# Patient Record
Sex: Female | Born: 1975 | Race: White | Hispanic: No | Marital: Married | Smoking: Never smoker
Health system: Southern US, Community
[De-identification: ages and names within clinical notes are randomized; demographics above are authoritative.]

## PROBLEM LIST (undated history)

## (undated) DIAGNOSIS — E041 Nontoxic single thyroid nodule: Secondary | ICD-10-CM

## (undated) DIAGNOSIS — I472 Ventricular tachycardia, unspecified: Secondary | ICD-10-CM

## (undated) DIAGNOSIS — Z8619 Personal history of other infectious and parasitic diseases: Secondary | ICD-10-CM

## (undated) HISTORY — DX: Nontoxic single thyroid nodule: E04.1

## (undated) HISTORY — DX: Personal history of other infectious and parasitic diseases: Z86.19

---

## 1998-03-22 ENCOUNTER — Other Ambulatory Visit: Admission: RE | Admit: 1998-03-22 | Discharge: 1998-03-22 | Payer: Self-pay | Admitting: Family Medicine

## 1999-02-27 ENCOUNTER — Inpatient Hospital Stay (HOSPITAL_COMMUNITY): Admission: AD | Admit: 1999-02-27 | Discharge: 1999-03-02 | Payer: Self-pay | Admitting: Obstetrics & Gynecology

## 1999-03-03 ENCOUNTER — Encounter (HOSPITAL_COMMUNITY): Admission: RE | Admit: 1999-03-03 | Discharge: 1999-06-01 | Payer: Self-pay | Admitting: Obstetrics & Gynecology

## 1999-04-24 ENCOUNTER — Other Ambulatory Visit: Admission: RE | Admit: 1999-04-24 | Discharge: 1999-04-24 | Payer: Self-pay | Admitting: Obstetrics & Gynecology

## 2000-08-02 ENCOUNTER — Other Ambulatory Visit: Admission: RE | Admit: 2000-08-02 | Discharge: 2000-08-02 | Payer: Self-pay | Admitting: Obstetrics & Gynecology

## 2002-02-02 ENCOUNTER — Other Ambulatory Visit: Admission: RE | Admit: 2002-02-02 | Discharge: 2002-02-02 | Payer: Self-pay | Admitting: Obstetrics & Gynecology

## 2002-10-15 DIAGNOSIS — E041 Nontoxic single thyroid nodule: Secondary | ICD-10-CM

## 2002-10-15 HISTORY — DX: Nontoxic single thyroid nodule: E04.1

## 2003-02-06 ENCOUNTER — Inpatient Hospital Stay (HOSPITAL_COMMUNITY): Admission: AD | Admit: 2003-02-06 | Discharge: 2003-02-08 | Payer: Self-pay | Admitting: Obstetrics and Gynecology

## 2003-02-28 ENCOUNTER — Inpatient Hospital Stay (HOSPITAL_COMMUNITY): Admission: AD | Admit: 2003-02-28 | Discharge: 2003-02-28 | Payer: Self-pay | Admitting: Obstetrics & Gynecology

## 2003-03-02 ENCOUNTER — Other Ambulatory Visit: Admission: RE | Admit: 2003-03-02 | Discharge: 2003-03-02 | Payer: Self-pay | Admitting: Obstetrics and Gynecology

## 2004-06-01 ENCOUNTER — Other Ambulatory Visit: Admission: RE | Admit: 2004-06-01 | Discharge: 2004-06-01 | Payer: Self-pay | Admitting: Obstetrics and Gynecology

## 2006-02-13 ENCOUNTER — Ambulatory Visit: Payer: Self-pay | Admitting: Internal Medicine

## 2009-08-05 ENCOUNTER — Inpatient Hospital Stay (HOSPITAL_COMMUNITY): Admission: AD | Admit: 2009-08-05 | Discharge: 2009-08-11 | Payer: Self-pay | Admitting: Cardiology

## 2009-08-05 ENCOUNTER — Ambulatory Visit: Payer: Self-pay | Admitting: Internal Medicine

## 2009-08-08 ENCOUNTER — Encounter: Payer: Self-pay | Admitting: Internal Medicine

## 2009-08-24 ENCOUNTER — Ambulatory Visit: Payer: Self-pay | Admitting: Internal Medicine

## 2010-12-05 ENCOUNTER — Encounter (HOSPITAL_COMMUNITY): Payer: Self-pay

## 2011-01-18 LAB — COMPREHENSIVE METABOLIC PANEL
Alkaline Phosphatase: 48 U/L (ref 39–117)
BUN: 7 mg/dL (ref 6–23)
CO2: 23 mEq/L (ref 19–32)
Calcium: 9.9 mg/dL (ref 8.4–10.5)
Chloride: 106 mEq/L (ref 96–112)
Creatinine, Ser: 0.72 mg/dL (ref 0.4–1.2)
Glucose, Bld: 101 mg/dL — ABNORMAL HIGH (ref 70–99)
Total Protein: 8.2 g/dL (ref 6.0–8.3)

## 2011-01-18 LAB — BASIC METABOLIC PANEL
BUN: 12 mg/dL (ref 6–23)
CO2: 25 mEq/L (ref 19–32)
Calcium: 9.1 mg/dL (ref 8.4–10.5)
GFR calc Af Amer: >60 mL/min (ref >60)
GFR calc non Af Amer: >60 mL/min (ref >60)
GFR calc non Af Amer: >60 mL/min (ref >60)
Sodium: 139 mEq/L (ref 135–145)

## 2011-01-18 LAB — CBC
HCT: 46.1 % — ABNORMAL HIGH (ref 36.0–46.0)
Hemoglobin: 16.6 g/dL — ABNORMAL HIGH (ref 12.0–15.0)
MCV: 92.7 fL (ref 78.0–100.0)
MCV: 94.7 fL (ref 78.0–100.0)
Platelets: 142 10*3/uL — ABNORMAL LOW (ref 150–400)
Platelets: 160 10*3/uL (ref 150–400)

## 2011-01-18 LAB — PROTIME-INR: Prothrombin Time: 11.9 seconds (ref 11.6–15.2)

## 2011-01-18 LAB — TSH: TSH: 2.838 u[IU]/mL (ref 0.350–4.500)

## 2011-01-18 LAB — MAGNESIUM: Magnesium: 2.2 mg/dL (ref 1.5–2.5)

## 2011-12-12 LAB — BASIC METABOLIC PANEL
BUN: 15 (ref 4–21)
CREATININE: 0.8 (ref 0.5–1.1)
GLUCOSE: 86
POTASSIUM: 4.1 (ref 3.4–5.3)
Sodium: 137 (ref 137–147)

## 2011-12-12 LAB — CBC AND DIFFERENTIAL
HCT: 47 — AB (ref 36–46)
HEMOGLOBIN: 15.7 (ref 12.0–16.0)
Platelets: 165 (ref 150–399)
WBC: 6.7

## 2011-12-12 LAB — HEPATIC FUNCTION PANEL
ALK PHOS: 46 (ref 25–125)
ALT: 9 (ref 7–35)
AST: 12 — AB (ref 13–35)
BILIRUBIN, TOTAL: 0.6

## 2011-12-12 LAB — LIPID PANEL
Cholesterol: 230 — AB (ref 0–200)
HDL: 82 — AB (ref 35–70)
LDL Cholesterol: 27
LDl/HDL Ratio: 3
TRIGLYCERIDES: 135 (ref 40–160)

## 2011-12-12 LAB — VITAMIN D 25 HYDROXY (VIT D DEFICIENCY, FRACTURES): VIT D 25 HYDROXY: 70

## 2011-12-12 LAB — TSH: TSH: 2.96 (ref 0.41–5.90)

## 2012-05-01 ENCOUNTER — Other Ambulatory Visit: Payer: Self-pay | Admitting: Physician Assistant

## 2012-05-01 DIAGNOSIS — K824 Cholesterolosis of gallbladder: Secondary | ICD-10-CM

## 2012-05-06 ENCOUNTER — Other Ambulatory Visit: Payer: Self-pay

## 2012-05-15 ENCOUNTER — Other Ambulatory Visit: Payer: Self-pay

## 2012-12-17 ENCOUNTER — Other Ambulatory Visit: Payer: Self-pay | Admitting: Endocrinology

## 2012-12-17 DIAGNOSIS — E049 Nontoxic goiter, unspecified: Secondary | ICD-10-CM

## 2012-12-19 ENCOUNTER — Other Ambulatory Visit: Payer: Self-pay

## 2012-12-23 ENCOUNTER — Other Ambulatory Visit: Payer: Self-pay

## 2012-12-24 ENCOUNTER — Ambulatory Visit
Admission: RE | Admit: 2012-12-24 | Discharge: 2012-12-24 | Disposition: A | Discharge status: Home / Self Care | Payer: Managed Care, Other (non HMO) | Source: Ambulatory Visit | Attending: Endocrinology | Admitting: Endocrinology

## 2012-12-24 ENCOUNTER — Ambulatory Visit
Admission: RE | Admit: 2012-12-24 | Discharge: 2012-12-24 | Disposition: A | Discharge status: Home / Self Care | Payer: Managed Care, Other (non HMO) | Source: Ambulatory Visit | Attending: Physician Assistant | Admitting: Physician Assistant

## 2012-12-24 DIAGNOSIS — E049 Nontoxic goiter, unspecified: Secondary | ICD-10-CM

## 2012-12-24 DIAGNOSIS — K824 Cholesterolosis of gallbladder: Secondary | ICD-10-CM

## 2013-01-02 DIAGNOSIS — R000 Tachycardia, unspecified: Secondary | ICD-10-CM | POA: Insufficient documentation

## 2013-01-06 LAB — CBC AND DIFFERENTIAL
HCT: 42 (ref 36–46)
Hemoglobin: 15 (ref 12.0–16.0)
Platelets: 140 — AB (ref 150–399)
WBC: 5.2

## 2013-01-06 LAB — BASIC METABOLIC PANEL
BUN: 11 (ref 4–21)
CREATININE: 0.7 (ref 0.5–1.1)
GLUCOSE: 80
Potassium: 4 (ref 3.4–5.3)
SODIUM: 140 (ref 137–147)

## 2013-01-06 LAB — LIPID PANEL
Cholesterol: 152 (ref 0–200)
HDL: 52 (ref 35–70)
LDL Cholesterol: 86
LDl/HDL Ratio: 2.9
Triglycerides: 71 (ref 40–160)

## 2013-01-06 LAB — HEPATIC FUNCTION PANEL
ALT: 13 (ref 7–35)
AST: 14 (ref 13–35)
Alkaline Phosphatase: 56 (ref 25–125)
BILIRUBIN, TOTAL: 0.8

## 2013-01-06 LAB — TSH: TSH: 3.45 (ref 0.41–5.90)

## 2013-01-16 ENCOUNTER — Telehealth: Payer: Self-pay | Admitting: Internal Medicine

## 2013-01-16 ENCOUNTER — Encounter (HOSPITAL_COMMUNITY): Payer: Self-pay | Admitting: Emergency Medicine

## 2013-01-16 ENCOUNTER — Emergency Department (HOSPITAL_COMMUNITY)
Admission: EM | Admit: 2013-01-16 | Discharge: 2013-01-16 | Disposition: A | Discharge status: Home / Self Care | Payer: Managed Care, Other (non HMO) | Attending: Emergency Medicine | Admitting: Emergency Medicine

## 2013-01-16 ENCOUNTER — Emergency Department (HOSPITAL_COMMUNITY): Payer: Managed Care, Other (non HMO)

## 2013-01-16 DIAGNOSIS — I472 Ventricular tachycardia, unspecified: Secondary | ICD-10-CM | POA: Insufficient documentation

## 2013-01-16 DIAGNOSIS — I4729 Other ventricular tachycardia: Secondary | ICD-10-CM | POA: Insufficient documentation

## 2013-01-16 DIAGNOSIS — Z79899 Other long term (current) drug therapy: Secondary | ICD-10-CM | POA: Insufficient documentation

## 2013-01-16 DIAGNOSIS — Z3202 Encounter for pregnancy test, result negative: Secondary | ICD-10-CM | POA: Insufficient documentation

## 2013-01-16 HISTORY — DX: Ventricular tachycardia, unspecified: I47.20

## 2013-01-16 HISTORY — DX: Ventricular tachycardia: I47.2

## 2013-01-16 LAB — CBC
HCT: 41.3 % (ref 36.0–46.0)
Hemoglobin: 15.3 g/dL — ABNORMAL HIGH (ref 12.0–15.0)
MCH: 31.8 pg (ref 26.0–34.0)
MCHC: 37 g/dL — ABNORMAL HIGH (ref 30.0–36.0)
Platelets: 166 10*3/uL (ref 150–400)
RBC: 4.81 MIL/uL (ref 3.87–5.11)
RDW: 11.7 % (ref 11.5–15.5)
WBC: 6.2 10*3/uL (ref 4.0–10.5)

## 2013-01-16 LAB — BASIC METABOLIC PANEL
BUN: 11 mg/dL (ref 6–23)
CO2: 24 mEq/L (ref 19–32)
Chloride: 101 mEq/L (ref 96–112)
GFR calc non Af Amer: >90 mL/min (ref >90)
Potassium: 3.7 mEq/L (ref 3.5–5.1)
Sodium: 137 mEq/L (ref 135–145)

## 2013-01-16 LAB — POCT PREGNANCY, URINE: Preg Test, Ur: NEGATIVE

## 2013-01-16 LAB — POCT I-STAT TROPONIN I

## 2013-01-16 NOTE — ED Notes (Signed)
Pt alert and mentating appropriately upon d/c teaching; pt given d/c teaching and follow up care instructions; pt states she has appointment with Dr Mayford Knife at 2pm this afternoon and is on her way there now; NAD not upon d/c. Pt has no further questions upon d/c teaching given.

## 2013-01-16 NOTE — Telephone Encounter (Signed)
New problem   Pt having dizziness/shaking. Pt at work and want to be seen by a doctor today. Please call pt concerning this matter.

## 2013-01-16 NOTE — Telephone Encounter (Signed)
Spoke with patient who states she has Hx V tach and Tuesday and today has felt dizziness, shaky, and like her blood is not circulating well.  Patient states she was hospitalized for v tach a few years ago and kind of feels like she did at that time.  Patient advised to go to nearest ED.  Patient states her husband is on the way to get her from work and she will have him drive her straight to [facility].

## 2013-01-16 NOTE — ED Notes (Signed)
Pt sts hx of nonsustained Vtach and now c/o dizziness and lightheadedness starting yesterday

## 2013-01-16 NOTE — ED Notes (Signed)
Pt denies dizziness and lightheadedness; pt denies numbness and tingling; pt denies n/v/d; pt alert and mentating appropriately.

## 2013-01-16 NOTE — ED Notes (Signed)
Marissa, PA at bedside. 

## 2013-01-16 NOTE — ED Notes (Signed)
Pt states "sometimes I feel it skips around my heart, like the arrythmia." Pt states sometimes she feels like she gets very short of breath occasionally when she talks and moves sometimes; pt states her dosage of metoprolol last Thursday; pt states she felt fine before taking medication this morning; pt states she felt a little dizzy from sitting to standing position

## 2013-01-16 NOTE — ED Notes (Signed)
Dr. Wentz at bedside. 

## 2013-01-16 NOTE — ED Provider Notes (Signed)
History     CSN: [id number]  Arrival date & time 01/16/13  1022   First MD Initiated Contact with Patient 01/16/13 1114      Chief Complaint  Patient presents with  . Dizziness    (Consider location/radiation/quality/duration/timing/severity/associated sxs/prior treatment) HPI Comments: Patient is a 37 y/o F with PMHx of VTach presenting to the ED with dizziness and lightheadedness that started today. Patient reported that she had at least 3 episodes of dizziness that occurred this morning while at work, lasting approximately 15-20 seconds. Patient stated that she felt like she was going to faint and that every time she takes a deep breathe she gets more light-headed. Denied chest pain, shortness of breathe, difficulty breathing, LOC, urinary symptoms, abdominal pain, leg swelling, calf pain.   Patient has a history of Kirby Funk - was diagnosed in 2010 after having an episode of dizziness and syncope. Patient follows-up with cardiology, Dr. Graciela Husbands and Dr. Mayford Knife. Patient stated that her medication regimen has changed - instead Metoprolol 50 mg AM and PM - she only takes Metoprolol 50 mg every morning with Flecainimide 100 mg.   Patient reported being on contraception for 4-5 years approximately - been on Mirena x 1 year. Denied complications, leg swelling, intermittent claudication, calf pain, travel.   The history is provided by the patient. No language interpreter was used.    Past Medical History  Diagnosis Date  . VT (ventricular tachycardia)     History reviewed. No pertinent past surgical history.  History reviewed. No pertinent family history.  History  Substance Use Topics  . Smoking status: Never Smoker   . Smokeless tobacco: Not on file  . Alcohol Use: No    OB History   Grav Para Term Preterm Abortions TAB SAB Ect Mult Living                  Review of Systems  Constitutional: Negative for fever and chills.  HENT: Negative for neck pain.   Eyes: Negative for  visual disturbance.  Respiratory: Negative for chest tightness and shortness of breath.   Cardiovascular: Negative for chest pain.  Gastrointestinal: Negative for abdominal pain.  Genitourinary: Negative for difficulty urinating.  Neurological: Positive for dizziness and light-headedness.  All other systems reviewed and are negative.    Allergies  Review of patient's allergies indicates no known allergies.  Home Medications   Current Outpatient Rx  Name  Route  Sig  Dispense  Refill  . BIOTIN 5000 PO   Oral   Take 1 capsule by mouth daily.         . cyanocobalamin (,VITAMIN B-12,) 1000 MCG/ML injection   Intramuscular   Inject 1,000 mcg into the muscle once.         . flecainide (TAMBOCOR) 100 MG tablet   Oral   Take 100 mg by mouth 2 (two) times daily.         . metoprolol (LOPRESSOR) 50 MG tablet   Oral   Take 50 mg by mouth daily.           BP 105/64  Pulse 73  Temp(Src) 97.4 F (36.3 C) (Oral)  Resp 17  SpO2 100%  LMP 01/09/2013  Physical Exam  Nursing note and vitals reviewed. Constitutional: She is oriented to person, place, and time. She appears well-developed and well-nourished. No distress.  HENT:  Head: Normocephalic and atraumatic.  Eyes: Conjunctivae and EOM are normal. Pupils are equal, round, and reactive to light. Right eye exhibits no  discharge. Left eye exhibits no discharge.  Neck: Normal range of motion. Neck supple. No tracheal deviation present.  Negative lymphadenopathy.  Cardiovascular: Normal rate, regular rhythm, normal heart sounds and intact distal pulses.  Exam reveals no friction rub.   No murmur heard. Peripheral pulses palpable Negative leg swelling No pitting edema  Pulmonary/Chest: Effort normal and breath sounds normal. No respiratory distress. She has no wheezes. She has no rales.  Lymphadenopathy:    She has no cervical adenopathy.  Neurological: She is alert and oriented to person, place, and time. No cranial  nerve deficit. She exhibits normal muscle tone. Coordination normal.  Skin: Skin is warm and dry. No rash noted. She is not diaphoretic. No erythema.  Psychiatric: She has a normal mood and affect. Her behavior is normal. Thought content normal.    ED Course  Procedures (including critical care time)  Labs Reviewed  CBC - Abnormal; Notable for the following:    Hemoglobin 15.3 (*)    MCHC 37.0 (*)    All other components within normal limits  BASIC METABOLIC PANEL - Abnormal; Notable for the following:    Glucose, Bld 110 (*)    All other components within normal limits  POCT PREGNANCY, URINE  POCT I-STAT TROPONIN I   Dg Chest 2 View  01/16/2013  *RADIOLOGY REPORT*  Clinical Data: Dizziness, weakness  CHEST - 2 VIEW  Comparison: None.  Findings: The lungs are clear. Mediastinal contours appear normal. The heart is within normal limits in size. No bony abnormality is seen.  IMPRESSION: No active lung disease.   Original Report Authenticated By: Dwyane Dee, M.D.     Date: 01/16/2013  Rate: 71  Rhythm: normal sinus rhythm  QRS Axis: normal  Intervals: normal  ST/T Wave abnormalities: nonspecific T wave changes  Conduction Disutrbances:none  Narrative Interpretation:   Old EKG Reviewed: none available    1. VT (ventricular tachycardia)       MDM  Patient is a 37 y/o F presenting to the ED after 3 episodes of dizziness/light-headedness that occurred this morning while at work, lasting approximately 15-20 seconds - as per patient. Denied chest pain, shortness of breathe, difficulty breathing. Patient reported taking medications at home as prescribed.  I personally evaluated and examined the patient.   DDx: Possible VTach episode  PERC score negative.  CBC negative findings BMP negative findings Troponins negative Pregancy negative Chest x-ray no active lung disease noted EKG negative STEMI/NSTEMI Orthostatics negative  Patient afebrile, normotensive, non-tachycardic,  alert and oriented. Stated improvement since arriving to the ED with dizziness. Patient aseptic, non-toxic appearing. Suspected episode of non-sustained VTach - possibly due to change in medication regimen. Discharged patient - patient stated that she is immediately going to see cardiologist once discharged - has an appointment at 2:00pm with Dr. Mayford Knife. Discussed with patient to follow-up with cardiologist and discuss change in medication regimen. Recommended holter monitor or event cardiac monitor for observation of heart functioning. Discussed with patient that if symptoms continue or worsen to please report back to the ED.  Patient agreed to plan of care, understood, all questions answered.   Filed Vitals:   01/16/13 1242 01/16/13 1244 01/16/13 1246 01/16/13 1246  BP: 109/59 111/60 105/64 105/64  Pulse: 61 68 70 73  Temp:      TempSrc:      Resp:    17  SpO2:    100%         [employer], PA-C 01/16/13 2254

## 2013-01-17 NOTE — ED Provider Notes (Signed)
Heather Rice is a 37 y.o. female with episodes of near-syncope today. No LOC. Assymptomatic in ED. She was recently had beta blocker lowered. In ED HR is about 63.  Pt is alert and calm. No respiratory distress. Appears well-hydrated.  Assessment: possible non-sustained VT. Pt is metabolically stable. No indication for further ED evaluation.    Plam; short-term f/u for later today (date of visit).   Medical screening examination/treatment/procedure(s) were conducted as a shared visit with non-physician practitioner(s) and myself.  I personally evaluated the patient during the encounter  Flint Melter, MD 01/17/13 [phone]

## 2013-01-27 ENCOUNTER — Encounter: Payer: Self-pay | Admitting: *Deleted

## 2013-01-28 ENCOUNTER — Ambulatory Visit (INDEPENDENT_AMBULATORY_CARE_PROVIDER_SITE_OTHER): Payer: Managed Care, Other (non HMO) | Admitting: Internal Medicine

## 2013-01-28 ENCOUNTER — Encounter: Payer: Self-pay | Admitting: Internal Medicine

## 2013-01-28 VITALS — BP 100/67 | HR 67 | Ht 69.0 in | Wt 174.4 lb

## 2013-01-28 DIAGNOSIS — I472 Ventricular tachycardia: Secondary | ICD-10-CM

## 2013-01-28 NOTE — Patient Instructions (Addendum)
Your physician recommends that you schedule a follow-up appointment in: 2 MONTHS WITH DR Graciela Husbands

## 2013-01-28 NOTE — Assessment & Plan Note (Signed)
The patient has left bundle branch block inferior axis PVCs and ventricular tachycardia 2010. Evaluation at that time was negative for A.RVC  presumably not withstanding the abnormal electrocardiogram.   I will try to get a copy of the signal average ECG. We'll probably be appropriate to repeat this. There is a recent taper from [facility] looking at the progression of abnormalities.

## 2013-01-28 NOTE — Progress Notes (Addendum)
ELECTROPHYSIOLOGY CONSULT NOTE  Patient ID: Heather Rice, MRN: [id number], DOB/AGE: Aug 27, 1976 37 y.o. Admit date: (Not on file) Date of Consult: 01/28/2013  Primary Physician: Dois Davenport., MD Primary Cardiologist: TT   Chief Complaint: ventricular tachycardia   HPI Heather Rice is a 37 y.o. 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 Calcium blockers and beta blockers at that time failed to control her symptoms and she was treated with flecainide significant improvement.  She saw Dr. Hal Hope a number of weeks ago and because of a notable bradycardia her beta blocker dose was adjusted. This was associated with a variety of senses of uneasiness that prompted an evaluation in the emergency room it was unrevealing.  She has been seeing Dr. Mayford Knife in the interim; repeat treadmill testing has been unrevealing.it was noted that ECG demonstrated T wave inversions V1-V4; MRI at that time was unrevealing. ECG at that time showed T-wave inversion     Past Medical History  Diagnosis Date  . VT (ventricular tachycardia)       Surgical History: No past surgical history on file.   Home Meds: Prior to Admission medications   Medication Sig Start Date End Date Taking? Authorizing Provider  BIOTIN 5000 PO Take 1 capsule by mouth daily.   Yes Historical Provider, MD  cholecalciferol (VITAMIN D) 1000 UNITS tablet Take 1,000 Units by mouth daily.   Yes Historical Provider, MD  flecainide (TAMBOCOR) 100 MG tablet Take 100 mg by mouth 2 (two) times daily.   Yes Historical Provider, MD  metoprolol (LOPRESSOR) 50 MG tablet Take 50 mg by mouth 2 (two) times daily.    Yes Historical Provider, MD     Allergies: No Known Allergies  History   Social History  . Marital Status: Married    Spouse Name: N/A    Number of Children: N/A  . Years of Education: N/A   Occupational History  . Not on file.   Social  History Main Topics  . Smoking status: Never Smoker   . Smokeless tobacco: Not on file  . Alcohol Use: No  . Drug Use: No  . Sexually Active: Not on file   Other Topics Concern  . Not on file   Social History Narrative  . No narrative on file     No family history on file.   ROS:  Please see the history of present illness.     All other systems reviewed and negative.    Physical Exam:   Blood pressure 100/67, pulse 67, height 5\' 9"  (1.753 m), weight 174 lb 6.4 oz (79.107 kg), last menstrual period 01/09/2013. General: Well developed, well nourished female in no acute distress. Head: Normocephalic, atraumatic, sclera non-icteric, no xanthomas, nares are without discharge. EENT: normal Lymph Nodes:  none Back: without scoliosis/kyphosis , no CVA tendersness Neck: Negative for carotid bruits. JVD not elevated. Lungs: Clear bilaterally to auscultation without wheezes, rales, or rhonchi. Breathing is unlabored. Heart: RRR with S1 S2. No  murmur , rubs, or gallops appreciated. Abdomen: Soft, non-tender, non-distended with normoactive bowel sounds. No hepatomegaly. No rebound/guarding. No obvious abdominal masses. Msk:  Strength and tone appear normal for age. Extremities: No clubbing or cyanosis. No edema.  Distal pedal pulses are 2+ and equal bilaterally. Skin: Warm and Dry Neuro: Alert and oriented X 3. CN III-XII intact Grossly normal sensory and motor function . Psych:  Responds to questions appropriately with a normal affect.  Labs: Cardiac Enzymes No results found for this basename: CKTOTAL, CKMB, TROPONINI,  in the last 72 hours CBC Lab Results  Component Value Date   WBC 6.2 01/16/2013   HGB 15.3* 01/16/2013   HCT 41.3 01/16/2013   MCV 85.9 01/16/2013   PLT 166 01/16/2013   PROTIME: No results found for this basename: LABPROT, INR,  in the last 72 hours Chemistry No results found for this basename: NA, K, CL, CO2, BUN, CREATININE, CALCIUM, LABALBU, PROT, BILITOT,  ALKPHOS, ALT, AST, GLUCOSE,  in the last 168 hours Lipids No results found for this basename: CHOL, HDL, LDLCALC, TRIG   BNP No results found for this basename: probnp   Miscellaneous No results found for this basename: DDIMER    Radiology/Studies:  Dg Chest 2 View  01/16/2013  *RADIOLOGY REPORT*  Clinical Data: Dizziness, weakness  CHEST - 2 VIEW  Comparison: None.  Findings: The lungs are clear. Mediastinal contours appear normal. The heart is within normal limits in size. No bony abnormality is seen.  IMPRESSION: No active lung disease.   Original Report Authenticated By: Dwyane Dee, M.D.     EKG:  Sinus rhythm at 55 Intervals 20/10/44 T-wave inversions V1-V5   Assessment and Plan:     Sherryl Manges

## 2013-02-26 ENCOUNTER — Institutional Professional Consult (permissible substitution): Payer: Managed Care, Other (non HMO) | Admitting: Internal Medicine

## 2013-03-31 ENCOUNTER — Encounter: Payer: Self-pay | Admitting: Internal Medicine

## 2013-03-31 ENCOUNTER — Ambulatory Visit (INDEPENDENT_AMBULATORY_CARE_PROVIDER_SITE_OTHER): Payer: Managed Care, Other (non HMO) | Admitting: Internal Medicine

## 2013-03-31 VITALS — BP 100/64 | HR 64 | Ht 69.0 in | Wt 180.8 lb

## 2013-03-31 DIAGNOSIS — R9431 Abnormal electrocardiogram [ECG] [EKG]: Secondary | ICD-10-CM

## 2013-03-31 DIAGNOSIS — I472 Ventricular tachycardia: Secondary | ICD-10-CM

## 2013-03-31 NOTE — Assessment & Plan Note (Signed)
The patient's ventricular arrhythmias well controlled currently on a combination of Lopressor and flecainide. She is tolerating his medications well. I would like to review her recent stress test. I also think is important to re -consider the possibility of underlying ARVC given the abnormalities ECG.

## 2013-03-31 NOTE — Progress Notes (Signed)
Patient Care Team: Dois Davenport, MD as PCP - General (Family Medicine)   HPI  Heather Rice is a 37 y.o. female Seen in followup for repetitive monomorphic ventricular tachycardia  And she is on flecainide and metoprolol. There is some fatigue with this. She recently underwent stress testing at [facility]. We do not have these results. Apparently the Myoview was normal. I am more interested in the stress testing portion   Past Medical History  Diagnosis Date  . VT (ventricular tachycardia)     No past surgical history on file.  Current Outpatient Prescriptions  Medication Sig Dispense Refill  . BIOTIN 5000 PO Take 1 capsule by mouth daily.      . cholecalciferol (VITAMIN D) 1000 UNITS tablet Take 1,000 Units by mouth daily.      . flecainide (TAMBOCOR) 100 MG tablet Take 100 mg by mouth 2 (two) times daily.      . metoprolol (LOPRESSOR) 50 MG tablet Take 50 mg by mouth 2 (two) times daily.        No current facility-administered medications for this visit.    No Known Allergies  Review of Systems negative except from HPI and PMH  Physical Exam BP 100/64  Pulse 64  Ht 5\' 9"  (1.753 m)  Wt 180 lb 12.8 oz (82.01 kg)  BMI 26.69 kg/m2 Well developed and nourished in no acute distress HENT normal Neck supple with JVP-flat Clear Regular rate and rhythm, no murmurs or gallops Abd-soft with active BS No Clubbing cyanosis edema Skin-warm and dry A & Oriented  Grossly normal sensory and motor function  ECG April 2014 demonstrated T-wave inversion V1-V5   Assessment and  Plan

## 2013-03-31 NOTE — Patient Instructions (Signed)
Your physician wants you to follow-up in:  6 MONTHS WITH  DR KLEIN  You will receive a reminder letter in the mail two months in advance. If you don't receive a letter, please call our office to schedule the follow-up appointment. Your physician recommends that you continue on your current medications as directed. Please refer to the Current Medication list given to you today.  

## 2013-03-31 NOTE — Assessment & Plan Note (Signed)
As above.

## 2013-05-13 ENCOUNTER — Encounter: Payer: Self-pay | Admitting: Internal Medicine

## 2013-07-03 ENCOUNTER — Telehealth: Payer: Self-pay | Admitting: Internal Medicine

## 2013-07-03 NOTE — Telephone Encounter (Signed)
No reaction listed for this abx with flecainide, Orma Flaming D confirmed.

## 2013-07-03 NOTE — Telephone Encounter (Signed)
New Problem  Pt wants to know if she can take the antibiotic listed below with her current medications.  Medication: Doxycyline 100 mg

## 2014-01-01 ENCOUNTER — Other Ambulatory Visit: Payer: Self-pay

## 2014-01-01 MED ORDER — FLECAINIDE ACETATE 100 MG PO TABS: 100.0000 mg | ORAL_TABLET | Freq: Two times a day (BID) | ORAL | Status: DC

## 2014-01-05 ENCOUNTER — Other Ambulatory Visit: Payer: Self-pay | Admitting: General Surgery

## 2014-01-05 MED ORDER — METOPROLOL TARTRATE 50 MG PO TABS: 50.0000 mg | ORAL_TABLET | Freq: Two times a day (BID) | ORAL | Status: DC

## 2014-01-05 NOTE — Telephone Encounter (Signed)
Sent one refill for pt. Pt needs to have OV before any more refills.

## 2014-01-21 ENCOUNTER — Other Ambulatory Visit: Payer: Self-pay

## 2014-01-21 MED ORDER — METOPROLOL TARTRATE 50 MG PO TABS: 50.0000 mg | ORAL_TABLET | Freq: Two times a day (BID) | ORAL | Status: DC

## 2014-01-22 ENCOUNTER — Other Ambulatory Visit: Payer: Self-pay

## 2014-02-09 ENCOUNTER — Other Ambulatory Visit: Payer: Self-pay | Admitting: *Deleted

## 2014-02-09 MED ORDER — FLECAINIDE ACETATE 100 MG PO TABS: 100.0000 mg | ORAL_TABLET | Freq: Two times a day (BID) | ORAL | Status: DC

## 2014-02-18 ENCOUNTER — Other Ambulatory Visit: Payer: Self-pay | Admitting: *Deleted

## 2014-02-18 MED ORDER — METOPROLOL TARTRATE 50 MG PO TABS: 50.0000 mg | ORAL_TABLET | Freq: Two times a day (BID) | ORAL | Status: DC

## 2014-02-22 ENCOUNTER — Encounter: Payer: Self-pay | Admitting: *Deleted

## 2014-02-26 ENCOUNTER — Ambulatory Visit (INDEPENDENT_AMBULATORY_CARE_PROVIDER_SITE_OTHER): Payer: Managed Care, Other (non HMO) | Admitting: Internal Medicine

## 2014-02-26 ENCOUNTER — Ambulatory Visit: Payer: Managed Care, Other (non HMO) | Admitting: Internal Medicine

## 2014-02-26 ENCOUNTER — Encounter: Payer: Self-pay | Admitting: Internal Medicine

## 2014-02-26 VITALS — BP 104/70 | HR 56 | Ht 69.0 in | Wt 174.0 lb

## 2014-02-26 DIAGNOSIS — I4729 Other ventricular tachycardia: Secondary | ICD-10-CM

## 2014-02-26 DIAGNOSIS — I472 Ventricular tachycardia, unspecified: Secondary | ICD-10-CM

## 2014-02-26 NOTE — Progress Notes (Signed)
      Patient Care Team: Dois Davenport, MD as PCP - General (Family Medicine)   HPI  Heather Rice is a 38 y.o. female Seen in followup for repetitive monomorphic ventricular tachycardia  And she is on flecainide and metoprolol. There is some fatigue with this.   Exercise test last July showed no evidence of ventricular tachycardia    She has no tachypalpitations  She struggles with anxiety     Past Medical History  Diagnosis Date  . VT (ventricular tachycardia)     No past surgical history on file.  Current Outpatient Prescriptions  Medication Sig Dispense Refill  . BIOTIN 5000 PO Take 1 capsule by mouth daily.      . cholecalciferol (VITAMIN D) 1000 UNITS tablet Take 1,000 Units by mouth daily.      . flecainide (TAMBOCOR) 100 MG tablet Take 1 tablet (100 mg total) by mouth 2 (two) times daily.  180 tablet  0  . metoprolol (LOPRESSOR) 50 MG tablet Take 1 tablet (50 mg total) by mouth 2 (two) times daily.  180 tablet  0   No current facility-administered medications for this visit.    No Known Allergies  Review of Systems negative except from HPI and PMH  Physical Exam BP 104/70  Pulse 56  Ht 5\' 9"  (1.753 m)  Wt 174 lb (78.926 kg)  BMI 25.68 kg/m2 Well developed and well nourished in no acute distress HENT normal E scleral and icterus clear Neck Supple JVP flat; carotids brisk and full Clear to ausculation  Regular rate and rhythm, no murmurs gallops or rub Soft with active bowel sounds No clubbing cyanosis  Edema Alert and oriented, grossly normal motor and sensory function Skin Warm and Dry   ECG demonstrates sinus rhythm at 55 intervals 16/09/42 there is low voltage and there are T-wave inversions V1-V5  Assessment and  Plan  Ventricular tachycardia   Abnormal ECG  Anxiety  We will re\re survey her heart with an echo. For right now, with a negative stress test a year ago we will not repeat that. We will anticipate genetic testing for  ARVC/CPVT  I need to look at the original data again to get a sense of what ventricular tachycardia look like.

## 2014-02-26 NOTE — Patient Instructions (Addendum)
Your physician recommends that you continue on your current medications as directed. Please refer to the Current Medication list given to you today.  Your physician has requested that you have an echocardiogram. Echocardiography is a painless test that uses sound waves to create images of your heart. It provides your doctor with information about the size and shape of your heart and how well your heart's chambers and valves are working. This procedure takes approximately one hour. There are no restrictions for this procedure.  Your physician recommends that you schedule a follow-up appointment in: 8-12 weeks with Dr. Graciela Husbands.  Michaelah Credeur Rad, RN will call you to discuss genetic testing.

## 2014-02-27 LAB — LIPID PANEL
CHOLESTEROL: 171 (ref 0–200)
HDL: 75 — AB (ref 35–70)
LDL Cholesterol: 85
LDl/HDL Ratio: 1.2
Triglycerides: 72 (ref 40–160)

## 2014-02-27 LAB — HEPATIC FUNCTION PANEL
ALK PHOS: 55 (ref 25–125)
ALT: 12 (ref 7–35)
AST: 13 (ref 13–35)
Bilirubin, Total: 0.8

## 2014-02-27 LAB — CBC AND DIFFERENTIAL
HCT: 44 (ref 36–46)
Hemoglobin: 15.1 (ref 12.0–16.0)
NEUTROS ABS: 3
Platelets: 164 (ref 150–399)
WBC: 5.5

## 2014-02-27 LAB — BASIC METABOLIC PANEL
BUN: 12 (ref 4–21)
Creatinine: 0.8 (ref 0.5–1.1)
GLUCOSE: 85
POTASSIUM: 4.3 (ref 3.4–5.3)
Sodium: 140 (ref 137–147)

## 2014-02-27 LAB — VITAMIN D 25 HYDROXY (VIT D DEFICIENCY, FRACTURES): VIT D 25 HYDROXY: 31.9

## 2014-02-27 LAB — TSH: TSH: 3.18 (ref 0.41–5.90)

## 2014-03-15 ENCOUNTER — Telehealth: Payer: Self-pay | Admitting: Internal Medicine

## 2014-03-15 NOTE — Telephone Encounter (Addendum)
Called patient and advised that Dr.Klein's nurse is off today but will call her back sometime this week to set up genetic testing. Will forward message to Dory Horn RN/ Sherri Rad RN.

## 2014-03-15 NOTE — Telephone Encounter (Signed)
New problem   Pt need to speak to nurse concerning scheduling her genetic testing. Please call pt.

## 2014-03-15 NOTE — Telephone Encounter (Signed)
I spoke with the patient and made her aware I will speak to Dr. Graciela Husbands on Thursday regarding her genetic testing. I will call her back after that.

## 2014-03-18 NOTE — Telephone Encounter (Signed)
I spoke with the patient. She wants to call [facility] and speak with them about their billing. I gave her the # to call. She will let us know what she decides to do. She also had billing questions about her echo scheduled for next week. I will forward a staff message to Charmaine to please call the patient.

## 2014-03-22 ENCOUNTER — Telehealth: Payer: Self-pay | Admitting: Internal Medicine

## 2014-03-22 NOTE — Telephone Encounter (Signed)
F/u   Pt returning a call from [facility].

## 2014-03-22 NOTE — Telephone Encounter (Signed)
New problem   Pt need to talk to you concerning an [place] she has scheduled. Please call pt.

## 2014-03-23 NOTE — Telephone Encounter (Signed)
Pt inquiring about another company for testing -- the one she spoke with is too expensive. She would like to find one that works with her [employer], Community education officer.  Informed pt that Herbert Seta will be out of the office until next week, and she would contact her about this when she returns to office -- pt is agreeable to this.

## 2014-03-23 NOTE — Telephone Encounter (Signed)
Patient asking about her out of pocket cost for echo testing coming up on Friday. She states that Kim/Deborah told her $284.26 out of pocket. Explained that I would look into this and get back with her on finding. She is agreeable to this.

## 2014-03-24 NOTE — Telephone Encounter (Signed)
Informed pt that our office could not have quoted that [employer] to her -- that Heather Rice has to come from insurance, and we have no way of knowing her out of pocket expense.  Then advised her to call back to insurance company and get clarification from them about confusion with this matter. She is agreeable to this.  She will call back with any further questions.

## 2014-03-25 NOTE — Telephone Encounter (Signed)
Follow up     Pt want to go to [facility] to have [facility] because of cost.  Would this be possible?  Echo is scheduled for tomorrow here.

## 2014-03-25 NOTE — Telephone Encounter (Signed)
Explained that we would more than happy to assist her with changing her echo to [facility]. Informed her that Jasmine December [facility]) would call her tomorrow morning to arrange this.  Pt agreeable to plan.

## 2014-03-26 ENCOUNTER — Other Ambulatory Visit (HOSPITAL_COMMUNITY): Payer: Managed Care, Other (non HMO)

## 2014-03-26 NOTE — Telephone Encounter (Signed)
Follow up    Hospital calling need to know the urgency of this test patient need done .  Cardiac Echo.

## 2014-03-26 NOTE — Telephone Encounter (Signed)
[facility] [place]) calling wanting to know how urgent the Echo needed to be done.  Advised would like ASAP due to assess Ventricular Tachy.  She will schedule and call pt.

## 2014-03-29 ENCOUNTER — Encounter: Payer: Self-pay | Admitting: *Deleted

## 2014-03-29 NOTE — Telephone Encounter (Signed)
Letter mailed to patient with contact numbers for [employer] to discuss billing options with [employer].

## 2014-05-02 ENCOUNTER — Other Ambulatory Visit: Payer: Self-pay | Admitting: Internal Medicine

## 2014-05-10 ENCOUNTER — Other Ambulatory Visit: Payer: Self-pay | Admitting: Internal Medicine

## 2014-05-20 ENCOUNTER — Telehealth: Payer: Self-pay | Admitting: Internal Medicine

## 2014-05-20 NOTE — Telephone Encounter (Signed)
New message          Pt would like to get genetic testing done before appt with klein / can you add the orders in?

## 2014-05-24 NOTE — Telephone Encounter (Signed)
I left a message for the patient to call. 

## 2014-05-24 NOTE — Telephone Encounter (Signed)
I spoke with the patient. She will have her [facility] testing done when she is here for her office visit with Dr. Graciela Husbands.

## 2014-05-27 ENCOUNTER — Other Ambulatory Visit: Payer: Self-pay | Admitting: Family Medicine

## 2014-05-27 DIAGNOSIS — Z8639 Personal history of other endocrine, nutritional and metabolic disease: Secondary | ICD-10-CM

## 2014-06-02 ENCOUNTER — Other Ambulatory Visit: Payer: Self-pay | Admitting: Family Medicine

## 2014-06-02 ENCOUNTER — Other Ambulatory Visit: Payer: Managed Care, Other (non HMO)

## 2014-06-02 DIAGNOSIS — K824 Cholesterolosis of gallbladder: Secondary | ICD-10-CM

## 2014-06-03 ENCOUNTER — Ambulatory Visit (INDEPENDENT_AMBULATORY_CARE_PROVIDER_SITE_OTHER): Payer: Managed Care, Other (non HMO) | Admitting: Internal Medicine

## 2014-06-03 ENCOUNTER — Encounter: Payer: Self-pay | Admitting: Internal Medicine

## 2014-06-03 VITALS — BP 116/74 | HR 57 | Ht 69.0 in | Wt 177.0 lb

## 2014-06-03 DIAGNOSIS — I472 Ventricular tachycardia, unspecified: Secondary | ICD-10-CM

## 2014-06-03 DIAGNOSIS — I4729 Other ventricular tachycardia: Secondary | ICD-10-CM

## 2014-06-03 NOTE — Progress Notes (Signed)
      Patient Care Team: Dois Davenport, MD as PCP - General (Family Medicine)   HPI  Heather Rice is a 38 y.o. female Seen in followup for repetitive monomorphic ventricular tachycardia  And she is on flecainide and metoprolol. There is some fatigue with this.  Exercise test last July showed no evidence of ventricular tachycardia  ECGs have shown T-wave inversion from V4 variably back thorugh the data we reviewed to 2007    She has no tachypalpitations  She struggles with anxiety   Past Medical History  Diagnosis Date  . VT (ventricular tachycardia)     No past surgical history on file.  Current Outpatient Prescriptions  Medication Sig Dispense Refill  . BIOTIN 5000 PO Take 1 capsule by mouth daily.      . cholecalciferol (VITAMIN D) 1000 UNITS tablet Take 1,000 Units by mouth daily.      . flecainide (TAMBOCOR) 100 MG tablet TAKE 1 TABLET (100 MG TOTAL) BY MOUTH 2 (TWO) TIMES DAILY.  180 tablet  0  . metoprolol (LOPRESSOR) 50 MG tablet TAKE 1 TABLET BY MOUTH TWICE A DAY (NEED APPT)  180 tablet  0  . levonorgestrel (MIRENA) 20 MCG/24HR IUD 1 each by Intrauterine route as directed.       No current facility-administered medications for this visit.    No Known Allergies  Review of Systems negative except from HPI and PMH  Physical Exam BP 116/74  Pulse 57  Ht 5\' 9"  (1.753 m)  Wt 177 lb (80.287 kg)  BMI 26.13 kg/m2 Well developed and well nourished in no acute distress HENT normal E scleral and icterus clear Neck Supple JVP flat; carotids brisk and full Clear to ausculation Regular rate and rhythm, no murmurs gallops or rub Soft with active bowel sounds No clubbing cyanosis  Edema Alert and oriented, grossly normal motor and sensory function Skin Warm and Dry     Assessment and Plan  Ventricular tachycardia  Abnormal ECG  Anxiety  Her ECG continues to have variable manifestations of ARVC with late right precordial activation as well as T-wave  inversions in the anterior precordium through V4.  She a recent echo at [facility] about which no comments are available regarding right ventricle  We will look into getting a cardiac MRI now with the ECG evolution and a signal average ECG.  She raises the possibility as to whether the flecainide could be responsible for the T-wave inversions.  We also discussed gene testing today which was initiated.  We spent greater than hour.

## 2014-06-14 ENCOUNTER — Ambulatory Visit
Admission: RE | Admit: 2014-06-14 | Discharge: 2014-06-14 | Disposition: A | Discharge status: Home / Self Care | Payer: Managed Care, Other (non HMO) | Source: Ambulatory Visit | Attending: Family Medicine | Admitting: Family Medicine

## 2014-06-14 ENCOUNTER — Other Ambulatory Visit: Payer: Managed Care, Other (non HMO)

## 2014-06-14 DIAGNOSIS — Z8639 Personal history of other endocrine, nutritional and metabolic disease: Secondary | ICD-10-CM

## 2014-06-14 DIAGNOSIS — K824 Cholesterolosis of gallbladder: Secondary | ICD-10-CM

## 2014-06-25 ENCOUNTER — Other Ambulatory Visit: Payer: Self-pay | Admitting: *Deleted

## 2014-06-25 DIAGNOSIS — R9431 Abnormal electrocardiogram [ECG] [EKG]: Secondary | ICD-10-CM

## 2014-06-25 DIAGNOSIS — I428 Other cardiomyopathies: Secondary | ICD-10-CM

## 2014-06-28 ENCOUNTER — Other Ambulatory Visit: Payer: Self-pay | Admitting: *Deleted

## 2014-06-28 ENCOUNTER — Other Ambulatory Visit (HOSPITAL_COMMUNITY): Payer: Managed Care, Other (non HMO)

## 2014-06-28 ENCOUNTER — Encounter: Payer: Self-pay | Admitting: Internal Medicine

## 2014-06-28 ENCOUNTER — Telehealth: Payer: Self-pay | Admitting: *Deleted

## 2014-06-28 NOTE — Telephone Encounter (Signed)
Message copied by Baird Lyons on Mon Jun 28, 2014  9:20 AM ------      Message from: MCVEY, [place] K      Created: Fri Jun 25, 2014  3:20 PM      Regarding: RE: Signal Average ECG       When I called the pt with this appointment she ask I cancel it until you call her, she has question about the test.  She wants to know what they will be doing.  She also wanted the CPT code 42395 so she can check with her insurance before she will schedule.  Please give her a call  Thanks                        ----- Message -----         From: Baird Lyons, RN         Sent: 06/25/2014  12:18 PM           To: Baird Lyons, RN, [facility]      Subject: Signal Average ECG                                       Please arrange a signal average ecg.                  Thx      :)       ------

## 2014-06-28 NOTE — Telephone Encounter (Signed)
Spoke with patient. Gave her requested code. She is going to discuss with insurance company before making a decision on testing. She will call me back when she is ready to schedule testing.

## 2014-07-14 ENCOUNTER — Ambulatory Visit (HOSPITAL_COMMUNITY): Admission: RE | Admit: 2014-07-14 | Payer: Managed Care, Other (non HMO) | Source: Ambulatory Visit

## 2014-07-15 ENCOUNTER — Telehealth: Payer: Self-pay | Admitting: Internal Medicine

## 2014-07-15 NOTE — Telephone Encounter (Signed)
I spoke with the patient today regarding her Gene DX report (negative). She was asking if Dr. Graciela Husbands was able to obtain and review EKG's from 2007 from her initial visits with Dr. Mayford Knife. I explained to her that I was uncertain of this, but there are no 2007 EKG's in EPIC to review, only an event monitor. I advised the patient I would try to obtain her 2007 EKG's for Dr. Graciela Husbands to review and we will call her back once this has been done. She is agreeable. Sherri Rad, RN, BSN  Discussed with Duwayne Heck and Amy (formerly [employer] employees) and they are unable to pull a 2007 EKG report from the system they have direct access to. I was instructed to call Lincoln Maxin ([phone]) at [facility] for help obtaining records. I have called and left her a message to please send 2007 EKG's on the patient to Dr. Graciela Husbands at [phone]. Sherri is also aware that these should be coming. Sherri Rad, RN, BSN

## 2014-07-15 NOTE — Telephone Encounter (Signed)
No EKGs were found. Will review information on hand with Dr. Graciela Husbands next week.

## 2014-07-30 ENCOUNTER — Other Ambulatory Visit: Payer: Self-pay

## 2014-08-05 ENCOUNTER — Other Ambulatory Visit: Payer: Self-pay | Admitting: Internal Medicine

## 2014-08-09 ENCOUNTER — Other Ambulatory Visit: Payer: Self-pay

## 2014-08-09 MED ORDER — FLECAINIDE ACETATE 100 MG PO TABS: ORAL_TABLET | ORAL | Status: DC

## 2014-08-16 ENCOUNTER — Ambulatory Visit (HOSPITAL_COMMUNITY): Admission: RE | Admit: 2014-08-16 | Payer: Managed Care, Other (non HMO) | Source: Ambulatory Visit

## 2014-12-19 IMAGING — US US SOFT TISSUE HEAD/NECK
1 series · 14 of 25 positions shown · non-contrast
Comparison: 04/28/2007

CLINICAL DATA: Goiter

THYROID ULTRASOUND
TECHNIQUE: Ultrasound examination of the thyroid gland and adjacent
soft tissues was performed.

[Series 1: us soft tissue head/neck · 0.08mm/px · 14 of 46 slices shown]
[im 1/46]
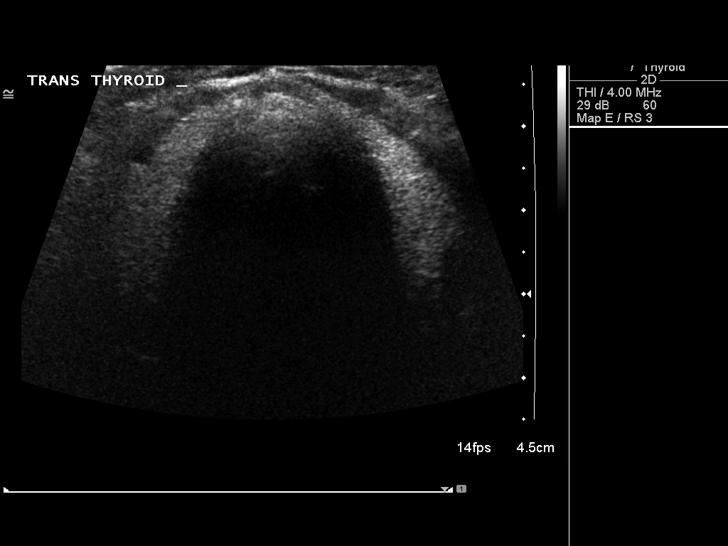
[im 4/46]
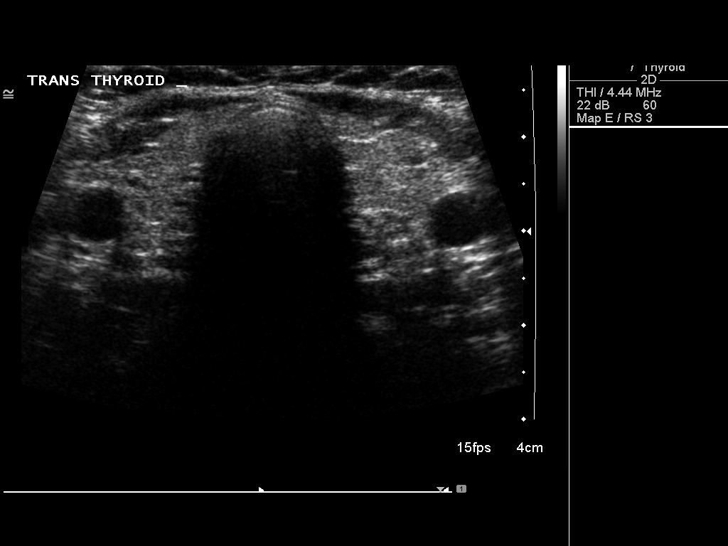
[im 8/46]
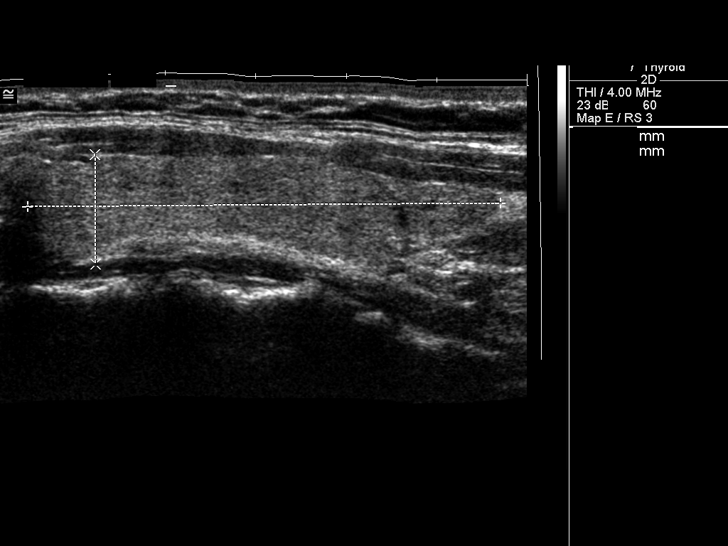
[im 12/46]
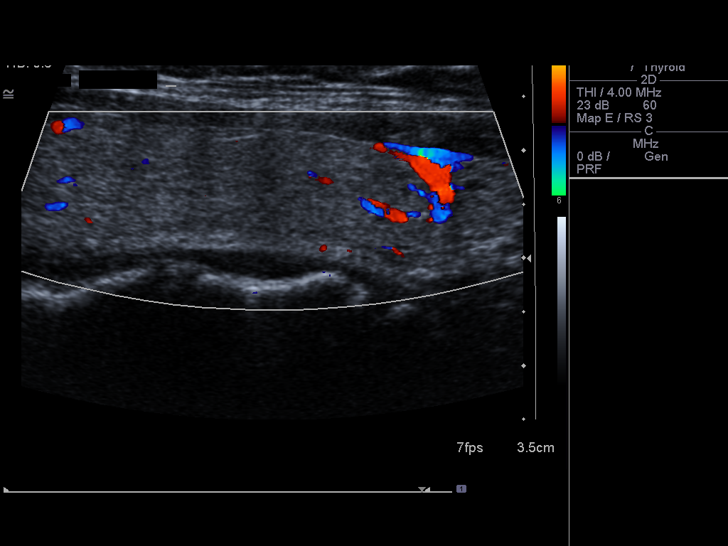
[im 16/46]
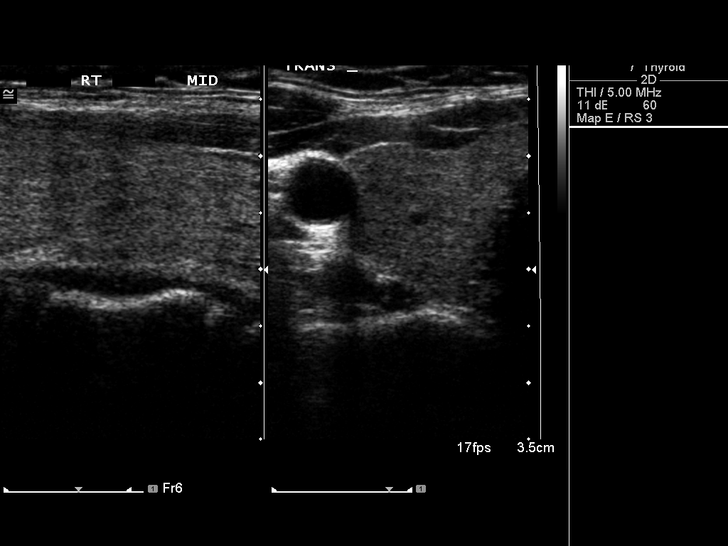
[im 17/46]
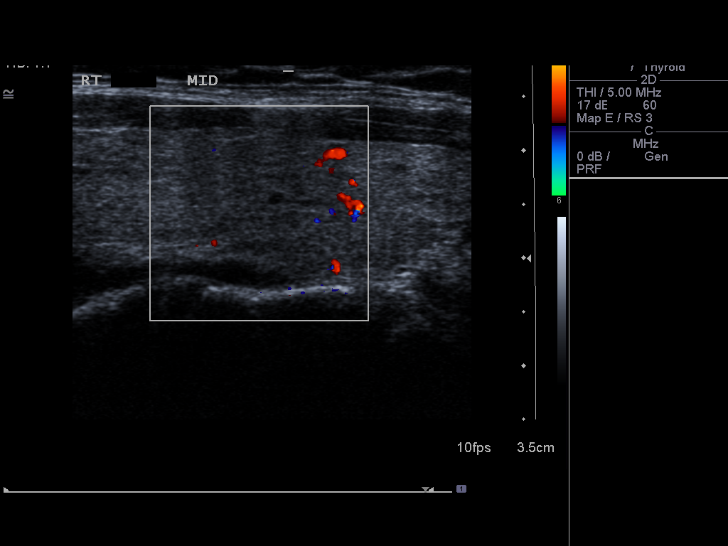
[im 21/46]
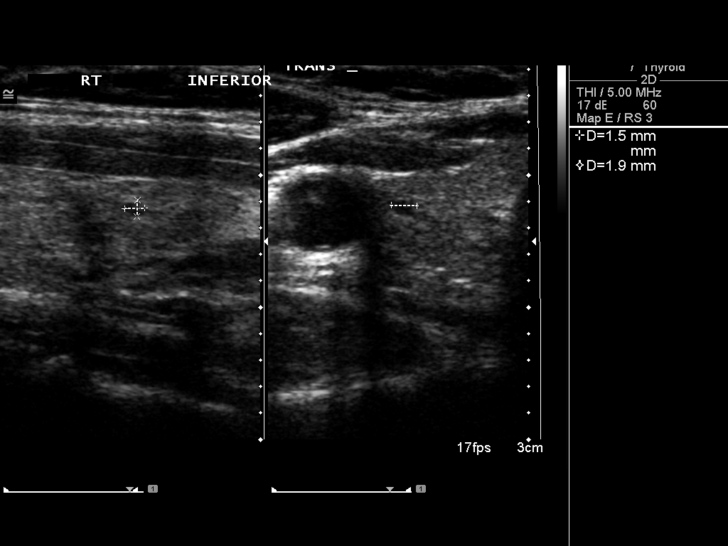
[im 25/46]
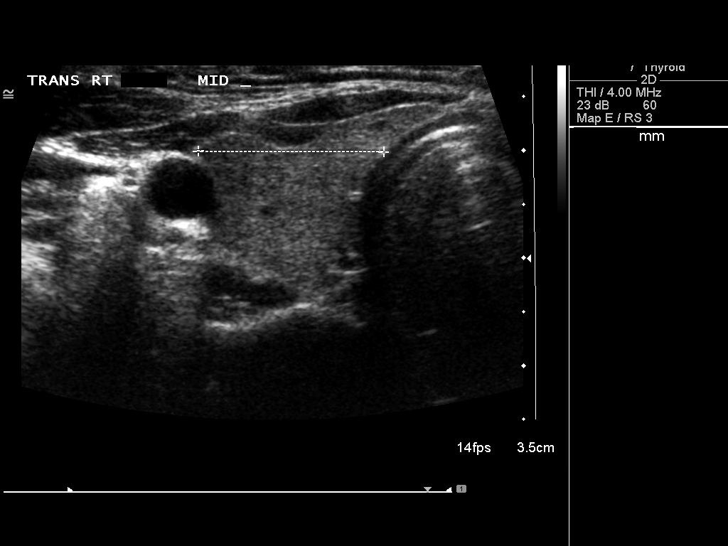
[im 29/46]
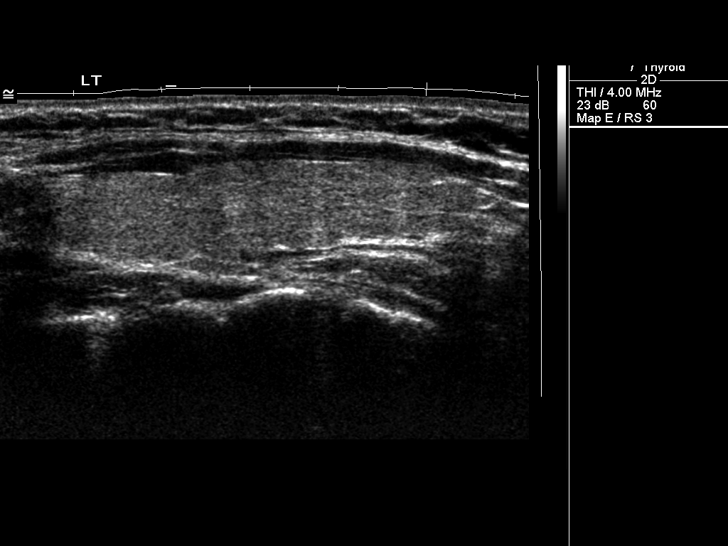
[im 31/46]
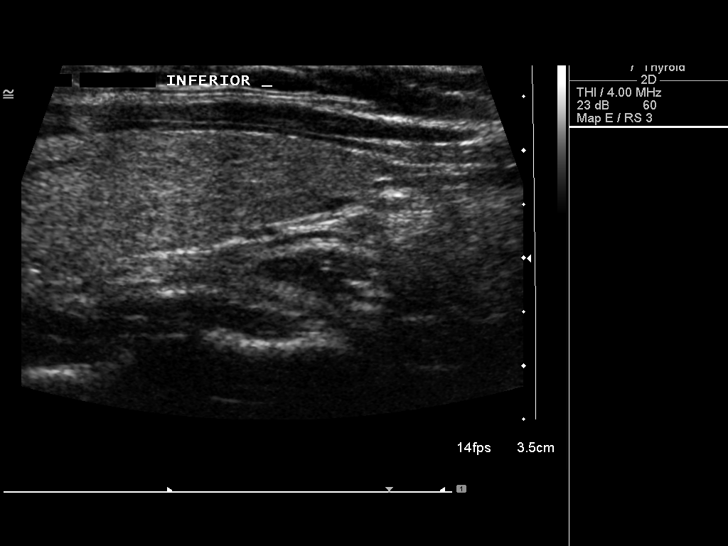
[im 34/46]
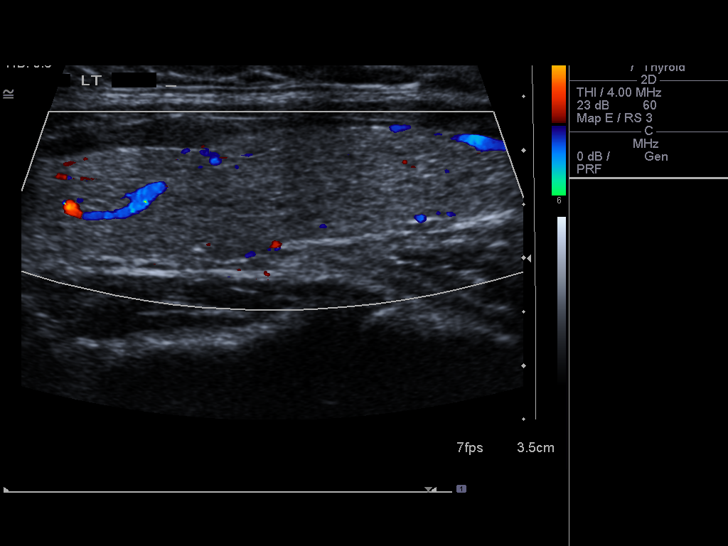
[im 38/46]
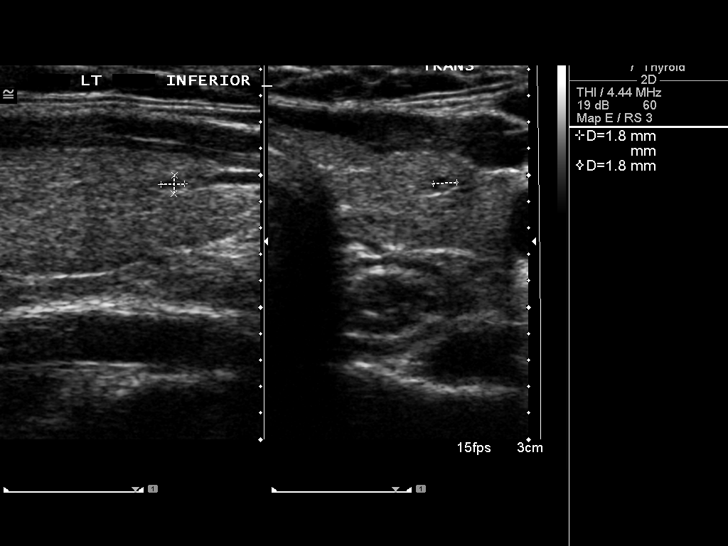
[im 42/46]
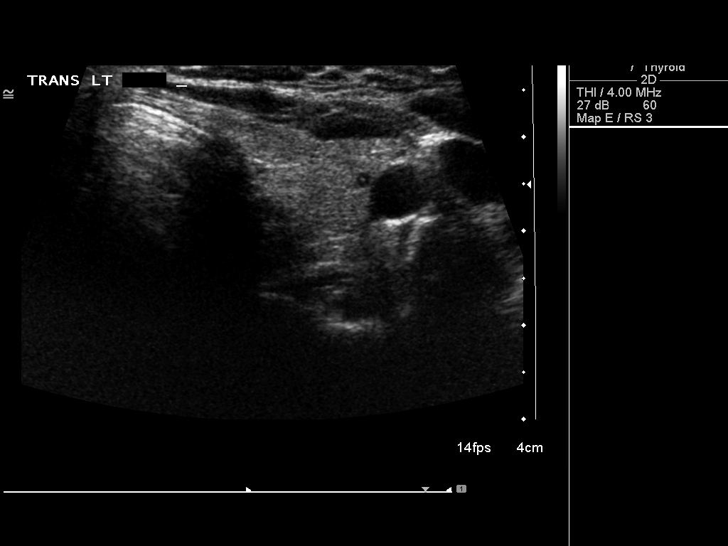
[im 46/46]
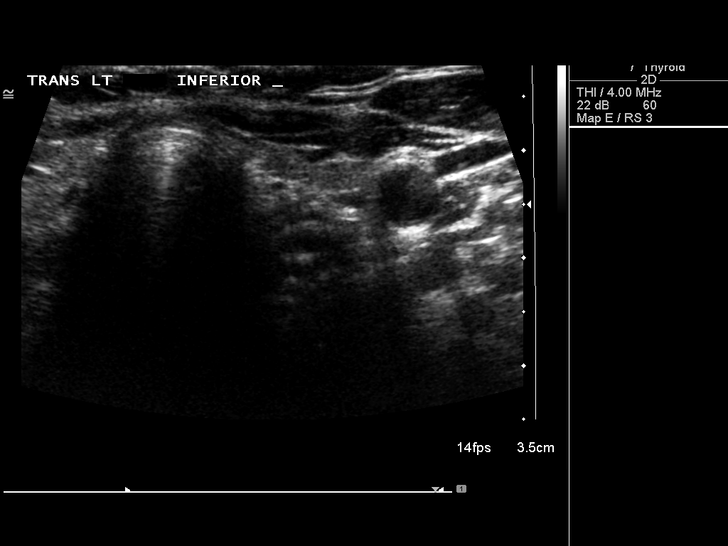

[14 of 25 positions shown; findings below may reference images not displayed]

FINDINGS: Right thyroid lobe:  12 x 17 x 52 mm, mildly inhomogeneous
background echotexture
Left thyroid lobe:  13 x 19 x 51 mm
Isthmus:  1.2 mm in thickness

Focal nodules:  There are at least five scattered 2 mm hypoechoic
nodules or cyst.

Lymphadenopathy:  None visualized.
IMPRESSION: Thyromegaly with small bilateral cysts or
nodules.Findings do not meet current consensus criteria for biopsy.
Follow-up by clinical exam is recommended.  If patient has known
risk factors for thyroid carcinoma, consider follow-up ultrasound
in 12 months.  If patient is clinically hyperthyroid, consider
nuclear medicine thyroid uptake and scan. This recommendation
follows the consensus statement:  Management of Thyroid Nodules
Detected as US:  Society of Radiologists in Ultrasound Consensus

## 2015-02-09 ENCOUNTER — Other Ambulatory Visit: Payer: Self-pay | Admitting: Internal Medicine

## 2015-04-16 ENCOUNTER — Emergency Department (HOSPITAL_COMMUNITY): Payer: Managed Care, Other (non HMO)

## 2015-04-16 ENCOUNTER — Emergency Department (HOSPITAL_COMMUNITY)
Admission: EM | Admit: 2015-04-16 | Discharge: 2015-04-16 | Disposition: A | Discharge status: Home / Self Care | Payer: Managed Care, Other (non HMO) | Attending: Emergency Medicine | Admitting: Emergency Medicine

## 2015-04-16 ENCOUNTER — Encounter (HOSPITAL_COMMUNITY): Payer: Self-pay | Admitting: *Deleted

## 2015-04-16 DIAGNOSIS — E876 Hypokalemia: Secondary | ICD-10-CM

## 2015-04-16 DIAGNOSIS — Z8679 Personal history of other diseases of the circulatory system: Secondary | ICD-10-CM | POA: Insufficient documentation

## 2015-04-16 DIAGNOSIS — R002 Palpitations: Secondary | ICD-10-CM

## 2015-04-16 DIAGNOSIS — Z79899 Other long term (current) drug therapy: Secondary | ICD-10-CM | POA: Diagnosis not present

## 2015-04-16 DIAGNOSIS — R5383 Other fatigue: Secondary | ICD-10-CM | POA: Diagnosis not present

## 2015-04-16 LAB — CBC
HCT: 42.5 % (ref 36.0–46.0)
HEMOGLOBIN: 15.3 g/dL — AB (ref 12.0–15.0)
MCH: 32.5 pg (ref 26.0–34.0)
MCHC: 36 g/dL (ref 30.0–36.0)
MCV: 90.2 fL (ref 78.0–100.0)
PLATELETS: 166 10*3/uL (ref 150–400)
RBC: 4.71 MIL/uL (ref 3.87–5.11)
RDW: 11.7 % (ref 11.5–15.5)
WBC: 6.8 10*3/uL (ref 4.0–10.5)

## 2015-04-16 LAB — I-STAT TROPONIN, ED: Troponin i, poc: 0 ng/mL (ref 0.00–0.08)

## 2015-04-16 LAB — COMPREHENSIVE METABOLIC PANEL
ALBUMIN: 4.3 g/dL (ref 3.5–5.0)
ALK PHOS: 57 U/L (ref 38–126)
ALT: 14 U/L (ref 14–54)
AST: 16 U/L (ref 15–41)
Anion gap: 6 (ref 5–15)
BILIRUBIN TOTAL: 0.4 mg/dL (ref 0.3–1.2)
BUN: 14 mg/dL (ref 6–20)
CALCIUM: 9.5 mg/dL (ref 8.9–10.3)
CO2: 26 mmol/L (ref 22–32)
Chloride: 102 mmol/L (ref 101–111)
Creatinine, Ser: 0.82 mg/dL (ref 0.44–1.00)
GFR calc Af Amer: >60 mL/min (ref >60)
Glucose, Bld: 127 mg/dL — ABNORMAL HIGH (ref 65–99)
POTASSIUM: 3.4 mmol/L — AB (ref 3.5–5.1)
SODIUM: 134 mmol/L — AB (ref 135–145)
TOTAL PROTEIN: 7.4 g/dL (ref 6.5–8.1)

## 2015-04-16 LAB — MAGNESIUM: Magnesium: 1.7 mg/dL (ref 1.7–2.4)

## 2015-04-16 MED ORDER — POTASSIUM CHLORIDE ER 10 MEQ PO TBCR: 10.0000 meq | EXTENDED_RELEASE_TABLET | Freq: Two times a day (BID) | ORAL | Status: DC

## 2015-04-16 NOTE — Discharge Instructions (Signed)
Hypokalemia Hypokalemia means that the amount of potassium in the blood is lower than normal.Potassium is a chemical, called an electrolyte, that helps regulate the amount of fluid in the body. It also stimulates muscle contraction and helps nerves function properly.Most of the body's potassium is inside of cells, and only a very small amount is in the blood. Because the amount in the blood is so small, minor changes can be life-threatening. CAUSES  Antibiotics.  Diarrhea or vomiting.  Using laxatives too much, which can cause diarrhea.  Chronic kidney disease.  Water pills (diuretics).  Eating disorders (bulimia).  Low magnesium level.  Sweating a lot. SIGNS AND SYMPTOMS  Weakness.  Constipation.  Fatigue.  Muscle cramps.  Mental confusion.  Skipped heartbeats or irregular heartbeat (palpitations).  Tingling or numbness. DIAGNOSIS  Your health care provider can diagnose hypokalemia with blood tests. In addition to checking your potassium level, your health care provider may also check other lab tests. TREATMENT Hypokalemia can be treated with potassium supplements taken by mouth or adjustments in your current medicines. If your potassium level is very low, you may need to get potassium through a vein (IV) and be monitored in the hospital. A diet high in potassium is also helpful. Foods high in potassium are:  Nuts, such as peanuts and pistachios.  Seeds, such as sunflower seeds and pumpkin seeds.  Peas, lentils, and lima beans.  Whole grain and bran cereals and breads.  Fresh fruit and vegetables, such as apricots, avocado, bananas, cantaloupe, kiwi, oranges, tomatoes, asparagus, and potatoes.  Orange and tomato juices.  Red meats.  Fruit yogurt. HOME CARE INSTRUCTIONS  Take all medicines as prescribed by your health care provider.  Maintain a healthy diet by including nutritious food, such as fruits, vegetables, nuts, whole grains, and lean meats.  If  you are taking a laxative, be sure to follow the directions on the label. SEEK MEDICAL CARE IF:  Your weakness gets worse.  You feel your heart pounding or racing.  You are vomiting or having diarrhea.  You are diabetic and having trouble keeping your blood glucose in the normal range. SEEK IMMEDIATE MEDICAL CARE IF:  You have chest pain, shortness of breath, or dizziness.  You are vomiting or having diarrhea for more than 2 days.  You faint. MAKE SURE YOU:   Understand these instructions.  Will watch your condition.  Will get help right away if you are not doing well or get worse. Document Released: 10/01/2005 Document Revised: 07/22/2013 Document Reviewed: 04/03/2013 [facility] Patient Information 2015 [place], [place]. This information is not intended to replace advice given to you by your health care provider. Make sure you discuss any questions you have with your health care provider.  Palpitations A palpitation is the feeling that your heartbeat is irregular or is faster than normal. It may feel like your heart is fluttering or skipping a beat. Palpitations are usually not a serious problem. However, in some cases, you may need further medical evaluation. CAUSES  Palpitations can be caused by:  Smoking.  Caffeine or other stimulants, such as diet pills or energy drinks.  Alcohol.  Stress and anxiety.  Strenuous physical activity.  Fatigue.  Certain medicines.  Heart disease, especially if you have a history of irregular heart rhythms (arrhythmias), such as atrial fibrillation, atrial flutter, or supraventricular tachycardia.  An improperly working pacemaker or defibrillator. DIAGNOSIS  To find the cause of your palpitations, your health care provider will take your medical history and perform a physical  exam. Your health care provider may also have you take a test called an ambulatory electrocardiogram (ECG). An ECG records your heartbeat patterns over a 24-hour  period. You may also have other tests, such as:  Transthoracic echocardiogram (TTE). During echocardiography, sound waves are used to evaluate how blood flows through your heart.  Transesophageal echocardiogram (TEE).  Cardiac monitoring. This allows your health care provider to monitor your heart rate and rhythm in real time.  Holter monitor. This is a portable device that records your heartbeat and can help diagnose heart arrhythmias. It allows your health care provider to track your heart activity for several days, if needed.  Stress tests by exercise or by giving medicine that makes the heart beat faster. TREATMENT  Treatment of palpitations depends on the cause of your symptoms and can vary greatly. Most cases of palpitations do not require any treatment other than time, relaxation, and monitoring your symptoms. Other causes, such as atrial fibrillation, atrial flutter, or supraventricular tachycardia, usually require further treatment. HOME CARE INSTRUCTIONS   Avoid:  Caffeinated coffee, tea, soft drinks, diet pills, and energy drinks.  Chocolate.  Alcohol.  Stop smoking if you smoke.  Reduce your stress and anxiety. Things that can help you relax include:  A method of controlling things in your body, such as your heartbeats, with your mind (biofeedback).  Yoga.  Meditation.  Physical activity such as swimming, jogging, or walking.  Get plenty of rest and sleep. SEEK MEDICAL CARE IF:   You continue to have a fast or irregular heartbeat beyond 24 hours.  Your palpitations occur more often. SEEK IMMEDIATE MEDICAL CARE IF:  You have chest pain or shortness of breath.  You have a severe headache.  You feel dizzy or you faint. MAKE SURE YOU:  Understand these instructions.  Will watch your condition.  Will get help right away if you are not doing well or get worse. Document Released: 09/28/2000 Document Revised: 10/06/2013 Document Reviewed:  11/30/2011 [facility] Patient Information 2015 [place], [place]. This information is not intended to replace advice given to you by your health care provider. Make sure you discuss any questions you have with your health care provider.

## 2015-04-16 NOTE — ED Notes (Signed)
Pt states palpitations started at 0715.  States hx of v-tach with same feeling.  HR presently in 60's.  Denies chest pain, sob or nausea.  States took her regular 50mg  metoprolol an 100 flecanide this am.

## 2015-04-16 NOTE — ED Provider Notes (Signed)
CSN: [id number]     Arrival date & time 04/16/15  2536 History   First MD Initiated Contact with Patient 04/16/15 [phone]     Chief Complaint  Patient presents with  . Palpitations     (Consider location/radiation/quality/duration/timing/severity/associated sxs/prior Treatment) Patient is a 39 y.o. female presenting with palpitations. The history is provided by the patient.  Palpitations Palpitations quality:  Fast Associated symptoms: no back pain, no chest pain, no nausea, no numbness, no shortness of breath, no vomiting and no weakness    patient with palpitations. History of V. tach a few years ago. States she has episodes every couple weeks where she feels her heart briefly go fast. She states it lasted about an hour. States was beating more acutely. She did not get lightheadedness with it. She states she did get slight upper chest pain. No fevers. States she's been more fatigued last few months. She is on flecainide and metoprolol. States she missed 1 dose of flecainide yesterday but started back up onto it. No fevers. No dysuria. No cough.  Past Medical History  Diagnosis Date  . VT (ventricular tachycardia)    History reviewed. No pertinent past surgical history. No family history on file. History  Substance Use Topics  . Smoking status: Never Smoker   . Smokeless tobacco: Not on file  . Alcohol Use: No   OB History    No data available     Review of Systems  Constitutional: Positive for fatigue. Negative for activity change and appetite change.  Eyes: Negative for pain.  Respiratory: Negative for chest tightness and shortness of breath.   Cardiovascular: Positive for palpitations. Negative for chest pain and leg swelling.  Gastrointestinal: Negative for nausea, vomiting, abdominal pain and diarrhea.  Genitourinary: Negative for flank pain.  Musculoskeletal: Negative for back pain and neck stiffness.  Skin: Negative for rash.  Neurological: Negative for weakness, numbness  and headaches.  Psychiatric/Behavioral: Negative for behavioral problems.      Allergies  Review of patient's allergies indicates no known allergies.  Home Medications   Prior to Admission medications   Medication Sig Start Date End Date Taking? Authorizing Provider  BIOTIN 5000 PO Take 1 capsule by mouth daily.   Yes Historical Provider, MD  cholecalciferol (VITAMIN D) 1000 UNITS tablet Take 1,000 Units by mouth daily.   Yes Historical Provider, MD  flecainide (TAMBOCOR) 100 MG tablet TAKE 1 TABLET (100 MG TOTAL) BY MOUTH 2 (TWO) TIMES DAILY. 08/09/14  Yes Duke Salvia, MD  levonorgestrel (MIRENA) 20 MCG/24HR IUD 1 each by Intrauterine route as directed.   Yes Historical Provider, MD  metoprolol (LOPRESSOR) 50 MG tablet TAKE 1 TABLET BY MOUTH TWICE A DAY (NEED APPT) 02/10/15  Yes Duke Salvia, MD  potassium chloride (K-DUR) 10 MEQ tablet Take 1 tablet (10 mEq total) by mouth 2 (two) times daily. 04/16/15   Benjiman Core, MD   BP 100/62 mmHg  Pulse 58  Temp(Src) 97.8 F (36.6 C) (Oral)  Resp 18  Ht 5\' 9"  (1.753 m)  Wt 175 lb (79.379 kg)  BMI 25.83 kg/m2  SpO2 100%  LMP 04/06/2015 Physical Exam  Constitutional: She is oriented to person, place, and time. She appears well-developed and well-nourished.  HENT:  Head: Normocephalic and atraumatic.  Eyes: Pupils are equal, round, and reactive to light.  Neck: Normal range of motion. Neck supple.  Cardiovascular: Normal rate and regular rhythm.   No murmur heard. Pulmonary/Chest: Effort normal and breath sounds normal. No respiratory distress.  Abdominal: Soft. Bowel sounds are normal. She exhibits no distension.  Musculoskeletal: Normal range of motion.  Neurological: She is alert and oriented to person, place, and time. No cranial nerve deficit.  Skin: Skin is warm and dry.  Psychiatric: She has a normal mood and affect. Her speech is normal.  Nursing note and vitals reviewed.   ED Course  Procedures (including critical  care time) Labs Review Labs Reviewed  CBC - Abnormal; Notable for the following:    Hemoglobin 15.3 (*)    All other components within normal limits  COMPREHENSIVE METABOLIC PANEL - Abnormal; Notable for the following:    Sodium 134 (*)    Potassium 3.4 (*)    Glucose, Bld 127 (*)    All other components within normal limits  MAGNESIUM  I-STAT TROPOININ, ED    Imaging Review Dg Chest 2 View  04/16/2015   CLINICAL DATA:  Palpitations, wheezing at night for years.  EXAM: CHEST  2 VIEW  COMPARISON:  01/16/2013  FINDINGS: The heart size and mediastinal contours are within normal limits. Both lungs are clear. The visualized skeletal structures are unremarkable.  IMPRESSION: No active cardiopulmonary disease.   Electronically Signed   By: Charlett Nose M.D.   On: 04/16/2015 09:53     EKG Interpretation   Date/Time:  Saturday April 16 2015 08:55:24 EDT Ventricular Rate:  70 PR Interval:  186 QRS Duration: 96 QT Interval:  404 QTC Calculation: 436 R Axis:   35 Text Interpretation:  Normal sinus rhythm Low voltage QRS Septal infarct ,  age undetermined Abnormal ECG No significant change since last tracing  Confirmed by Rubin Payor  MD, Harrold Donath [phone]) on 04/16/2015 9:19:02 AM      MDM   Final diagnoses:  Palpitations  History of ventricular tachycardia  Hypokalemia    Patient with palpitations. History of V. tach. No V. tach on monitor her EKG here. However she has missed a dose of medicines and could be the cause. Mild hypokalemia that'll be oral lesions supplemented. Follow-up with her electrophysiologist.    Benjiman Core, MD 04/16/15 1125

## 2015-04-16 NOTE — ED Notes (Signed)
Patient transported to X-ray 

## 2015-04-16 NOTE — ED Notes (Signed)
I gave the patient a cup of ice water per Dr. Pickering. 

## 2015-04-20 ENCOUNTER — Telehealth: Payer: Self-pay | Admitting: Internal Medicine

## 2015-04-20 NOTE — Telephone Encounter (Signed)
New Message  Pt c/o medication issue:  1. Name of Medication: Flecanide   4. What is your medication issue? Pt was seen in the ED. States that she was advised that her Flecainide maybe too low. She was experiencing Heart flutter and irregular Heart beats. Pt request a call back from the nurse to discuss if a she will need an appt or a dosage change. Please call.

## 2015-04-20 NOTE — Telephone Encounter (Signed)
Patient states that she isn't sure what needs to be done, but would like to get Dr. Odessa Fleming opinion/recommendations on recent ED visit.  She has been living with this for years, but feels that medications may not be working as good and instead of increasing meds maybe consider ablation at this point.  She feels that maybe it is time to consult for VT ablation and would like to know if this is agreeable to Dr. Graciela Husbands. Reports Saturday morning being bad (before ED visit), night being ok, and Sunday being bad    (bad = palpitations) Aware that he is out of the office until Friday and will look over request then.  She is agreeable to me calling her once he reviews.

## 2015-04-22 NOTE — Telephone Encounter (Signed)
Advised Heather Rice recommends another monitor so that we can see palpitations and what exactly is going on. States she has worn several of these before.  Patient would like to hold off for now and will call back if she changes her mind.

## 2015-05-14 ENCOUNTER — Other Ambulatory Visit: Payer: Self-pay | Admitting: Internal Medicine

## 2015-07-04 ENCOUNTER — Ambulatory Visit (INDEPENDENT_AMBULATORY_CARE_PROVIDER_SITE_OTHER): Payer: Managed Care, Other (non HMO) | Admitting: Internal Medicine

## 2015-07-04 ENCOUNTER — Encounter: Payer: Self-pay | Admitting: Internal Medicine

## 2015-07-04 VITALS — BP 102/76 | HR 57 | Ht 69.0 in | Wt 179.8 lb

## 2015-07-04 DIAGNOSIS — I472 Ventricular tachycardia, unspecified: Secondary | ICD-10-CM

## 2015-07-04 LAB — BASIC METABOLIC PANEL
BUN: 11 mg/dL (ref 6–23)
CALCIUM: 9.5 mg/dL (ref 8.4–10.5)
CO2: 29 meq/L (ref 19–32)
Chloride: 103 mEq/L (ref 96–112)
Creatinine, Ser: 0.66 mg/dL (ref 0.40–1.20)
GFR: 107.01 mL/min (ref >60.00)
Glucose, Bld: 91 mg/dL (ref 70–99)
Potassium: 3.7 mEq/L (ref 3.5–5.1)
Sodium: 139 mEq/L (ref 135–145)

## 2015-07-04 MED ORDER — METOPROLOL TARTRATE 50 MG PO TABS: ORAL_TABLET | ORAL | Status: DC

## 2015-07-04 MED ORDER — FLECAINIDE ACETATE 100 MG PO TABS: ORAL_TABLET | ORAL | Status: DC

## 2015-07-04 NOTE — Patient Instructions (Addendum)
Medication Instructions: - no changes  Labwork: -Your physician recommends that you have lab work: today- BMP Procedures/Testing: - Your physician has requested that you have a cardiac MRI. Cardiac MRI uses a computer to create images of your heart as its beating, producing both still and moving pictures of your heart and major blood vessels. For further information please visit [email or web address]. Please follow the instruction sheet given to you today for more information.  - Your physician recommends you have a Signal Averaged EKG- this will be arranged at [facility].  Follow-Up: - Will be determined pending the results of the Cardiac MRI.  Any Additional Special Instructions Will Be Listed Below (If Applicable).

## 2015-07-04 NOTE — Progress Notes (Signed)
      Patient Care Team: Dois Davenport, MD as PCP - General (Family Medicine)   HPI  Heather Rice is a 39 y.o. 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 These were short nonsustained runs Effort to treat with calcium blockers and beta blockers were insufficient and so she ended up on  flecainide and metoprolol. There is some fatigue with this.   Exercise test July showed no evidence of ventricular tachycardia  ECGs have shown T-wave inversion from V4 variably back thorugh the data we reviewed to 2007  At her last visit we discussed MR signal average ECG; these were not done. She was seen in the hospital 7/16 with palpitations.  She has no tachypalpitations  She struggles with anxiety   Past Medical History  Diagnosis Date  . VT (ventricular tachycardia)     No past surgical history on file.  Current Outpatient Prescriptions  Medication Sig Dispense Refill  . cholecalciferol (VITAMIN D) 1000 UNITS tablet Take 1,000 Units by mouth daily.    . flecainide (TAMBOCOR) 100 MG tablet TAKE 1 TABLET (100 MG TOTAL) BY MOUTH 2 (TWO) TIMES DAILY. 180 tablet 3  . levonorgestrel (MIRENA) 20 MCG/24HR IUD 1 each by Intrauterine route as directed.    . metoprolol (LOPRESSOR) 50 MG tablet TAKE 1 TABLET BY MOUTH TWICE A DAY (NEED APPT) 180 tablet 0   No current facility-administered medications for this visit.    No Known Allergies  Review of Systems negative except from HPI and PMH  Physical Exam BP 102/76 mmHg  Pulse 57  Ht 5\' 9"  (1.753 m)  Wt 179 lb 12.8 oz (81.557 kg)  BMI 26.54 kg/m2 Well developed and well nourished in no acute distress HENT normal E scleral and icterus clear Neck Supple JVP flat; carotids brisk and full Clear to ausculation Regular rate and rhythm, no murmurs gallops or rub Soft  with active bowel sounds No clubbing cyanosis  Edema Alert and oriented, grossly normal motor and sensory function Skin Warm and Dry     Assessment and Plan  Ventricular tachycardia - repetitive monomorphic RVOT  Abnormal ECG   Anxiety  Her ECG continues to have variable manifestations of ARVC with late right precordial activation as well as T-wave inversions in the anterior precordium through V4.  We will undertake MRI and SAECG to look for evidence of ARVC   If neg she would like to conisder catheter ablation. It is her impression that her's tachycardia would be much more evident in the absence of drug therapy.  If there is evidence of ARVC, likelihood of catheter ablation being successful is less; furthermore might inform other decisions related to therapy    We spent more than 50% of our >45 min visit in face to face counseling regarding the above

## 2015-07-14 ENCOUNTER — Ambulatory Visit (HOSPITAL_COMMUNITY)
Admission: RE | Admit: 2015-07-14 | Discharge: 2015-07-14 | Disposition: A | Discharge status: Home / Self Care | Payer: Managed Care, Other (non HMO) | Source: Ambulatory Visit | Attending: Internal Medicine | Admitting: Internal Medicine

## 2015-07-14 DIAGNOSIS — R9431 Abnormal electrocardiogram [ECG] [EKG]: Secondary | ICD-10-CM | POA: Diagnosis not present

## 2015-07-14 DIAGNOSIS — I472 Ventricular tachycardia: Secondary | ICD-10-CM | POA: Diagnosis not present

## 2015-07-14 MED ORDER — GADOBENATE DIMEGLUMINE 529 MG/ML IV SOLN
25.0000 mL | Freq: Once | INTRAVENOUS | Status: AC | PRN
  Administered 2015-07-14: 25 mL via INTRAVENOUS

## 2015-07-15 ENCOUNTER — Ambulatory Visit: Payer: Managed Care, Other (non HMO) | Admitting: Internal Medicine

## 2015-07-18 ENCOUNTER — Telehealth: Payer: Self-pay | Admitting: Internal Medicine

## 2015-07-18 NOTE — Telephone Encounter (Signed)
New Message  Pt called request a call back to discuss results from MRI and EKG. Please call

## 2015-07-19 NOTE — Telephone Encounter (Signed)
MRI was normal  Sherri had SAECG which i saw but didn't read  Can we get from her?

## 2015-07-19 NOTE — Telephone Encounter (Signed)
Dr. Graciela Husbands- I have the Cardiac MRI report. Did you read the SAECG?

## 2015-07-20 NOTE — Telephone Encounter (Addendum)
I spoke with the patient and she is aware of her results. Per Dr. Graciela Husbands- Cardiac MRI is normal and SAECG is slightly abnormal. He has looked back at some of his original notes and states that patient may benefit from evaluation with Dr. Johney Frame for possible PVC ablation. She is agreeable with scheduling this. I will forward a message to [facility] to arrange.

## 2015-07-21 NOTE — Telephone Encounter (Signed)
The last time I saw it in Dr. Odessa Fleming "red folder".  I have called the hospital and they are faxing another copy to [place].

## 2015-07-21 NOTE — Telephone Encounter (Signed)
Copy placed in red folder

## 2015-07-26 DIAGNOSIS — K824 Cholesterolosis of gallbladder: Secondary | ICD-10-CM | POA: Insufficient documentation

## 2015-07-26 DIAGNOSIS — F410 Panic disorder [episodic paroxysmal anxiety] without agoraphobia: Secondary | ICD-10-CM | POA: Insufficient documentation

## 2015-07-26 DIAGNOSIS — E041 Nontoxic single thyroid nodule: Secondary | ICD-10-CM | POA: Insufficient documentation

## 2015-07-27 LAB — CBC AND DIFFERENTIAL
HCT: 43 (ref 36–46)
Hemoglobin: 14.8 (ref 12.0–16.0)
NEUTROS ABS: 3
Platelets: 182 (ref 150–399)
WBC: 6.5

## 2015-07-27 LAB — HEPATIC FUNCTION PANEL
ALK PHOS: 57 (ref 25–125)
ALT: 17 (ref 7–35)
AST: 16 (ref 13–35)
BILIRUBIN, TOTAL: 0.7

## 2015-07-27 LAB — LIPID PANEL
Cholesterol: 207 — AB (ref 0–200)
HDL: 75 — AB (ref 35–70)
LDL CALC: 118
TRIGLYCERIDES: 72 (ref 40–160)

## 2015-07-27 LAB — BASIC METABOLIC PANEL
BUN: 13 (ref 4–21)
CREATININE: 0.7 (ref 0.5–1.1)
GLUCOSE: 85
Potassium: 4.5 (ref 3.4–5.3)
Sodium: 144 (ref 137–147)

## 2015-07-27 LAB — TSH: TSH: 2.3 (ref 0.41–5.90)

## 2015-08-01 ENCOUNTER — Ambulatory Visit: Payer: Managed Care, Other (non HMO) | Admitting: Internal Medicine

## 2015-08-01 ENCOUNTER — Encounter: Payer: Self-pay | Admitting: Internal Medicine

## 2015-08-01 VITALS — BP 132/88 | HR 61 | Ht 69.0 in | Wt 182.0 lb

## 2015-08-01 DIAGNOSIS — I472 Ventricular tachycardia, unspecified: Secondary | ICD-10-CM

## 2015-08-01 DIAGNOSIS — I493 Ventricular premature depolarization: Secondary | ICD-10-CM

## 2015-08-01 NOTE — Patient Instructions (Addendum)
Medication Instructions:  Your physician recommends that you continue on your current medications as directed. Please refer to the Current Medication list given to you today.   Labwork:  Your physician recommends that you return for lab work on 09/20/15 at 4:45    Testing/Procedures: Your physician has recommended that you have an ablation. Catheter ablation is a medical procedure used to treat some cardiac arrhythmias (irregular heartbeats). During catheter ablation, a long, thin, flexible tube is put into a blood vessel in your groin (upper thigh), or neck. This tube is called an ablation catheter. It is then guided to your heart through the blood vessel. Radio frequency waves destroy small areas of heart tissue where abnormal heartbeats may cause an arrhythmia to start. Please see the instruction sheet given to you today.---scheduled on 09/27/15  Will need to be at the hospital at 5:30am  Will need to hold you Flecainide and Metoprolol for 72 hours prior   Follow-Up: Your physician recommends that you schedule a follow-up appointment in: 4 weeks from 09/27/15 with Dr Johney Frame   Any Other Special Instructions Will Be Listed Below (If Applicable).

## 2015-08-02 NOTE — Progress Notes (Signed)
Electrophysiology Office Note   Date:  08/02/2015   ID:  Heather Rice, DOB 11/28/1975, MRN [id number]  PCP:  Delbert Harness, MD   Primary Electrophysiologist: Dr Graciela Husbands  Chief Complaint  Patient presents with  . Palpitations  . VT     History of Present Illness: Heather Rice is a 39 y.o. female who presents today for electrophysiology evaluation.  She has a h/o VT, NSVT, and PVCs.  She has been managed by Dr Graciela Husbands for several years.  Though she has done very well with flecainide and beta blocker therapy, she reports fatigue with beta blockers and occasional breakthrough episodes.  She would like to consider ablation.   Today, she denies symptoms of  chest pain, shortness of breath, orthopnea, PND, lower extremity edema, claudication, dizziness, presyncope, syncope, bleeding, or neurologic sequela. The patient is tolerating medications without difficulties and is otherwise without complaint today.    Past Medical History  Diagnosis Date  . VT (ventricular tachycardia) (HCC)    No past surgical history on file.   Current Outpatient Prescriptions  Medication Sig Dispense Refill  . cholecalciferol (VITAMIN D) 1000 UNITS tablet Take 1,000 Units by mouth daily.    . flecainide (TAMBOCOR) 100 MG tablet TAKE 1 TABLET (100 MG TOTAL) BY MOUTH 2 (TWO) TIMES DAILY. 180 tablet 3  . levonorgestrel (MIRENA) 20 MCG/24HR IUD 1 each by Intrauterine route as directed.    . metoprolol (LOPRESSOR) 50 MG tablet Take 50 mg by mouth daily.     No current facility-administered medications for this visit.    Allergies:   Review of patient's allergies indicates no known allergies.   Social History:  The patient  reports that she has never smoked. She does not have any smokeless tobacco history on file. She reports that she does not drink alcohol or use illicit drugs.   Family History:  The patient denies FH of arrhythmia   ROS:  Please see the history of present illness.   All other systems  are reviewed and negative.    PHYSICAL EXAM: VS:  BP 132/88 mmHg  Pulse 61  Ht  (1.753 m)  Wt 182 lb (82.555 kg)  BMI 26.86 kg/m2 , BMI Body mass index is 26.86 kg/(m^2). GEN: Well nourished, well developed, in no acute distress HEENT: normal Neck: no JVD, carotid bruits, or masses Cardiac: RRR; no murmurs, rubs, or gallops,no edema  Respiratory:  clear to auscultation bilaterally, normal work of breathing GI: soft, nontender, nondistended, + BS MS: no deformity or atrophy Skin: warm and dry  Neuro:  Strength and sensation are intact Psych: euthymic mood, full affect  EKG:  EKG is ordered today. The ekg ordered today shows sinus rhythm   Recent Labs: 04/16/2015: ALT 14; Hemoglobin 15.3*; Magnesium 1.7; Platelets 166 07/04/2015: BUN 11; Creatinine, Ser 0.66; Potassium 3.7; Sodium 139    Lipid Panel  No results found for: CHOL, TRIG, HDL, CHOLHDL, VLDL, LDLCALC, LDLDIRECT   Wt Readings from Last 3 Encounters:  08/01/15 182 lb (82.555 kg)  07/04/15 179 lb 12.8 oz (81.557 kg)  04/16/15 175 lb (79.379 kg)      Other studies Reviewed: Additional studies/ records that were reviewed today include: Dr Koren Bound notes, multiple ekgs  Review of the above records today demonstrates: I do not see PVCs on ekgs   ASSESSMENT AND PLAN:  1.  PVCs/ VT The patient has a h/o recurrent symptomatic VT.  She has been treated with flecainide and metoprolol as above. The majority of  her ekgs reveal sinus rhythm.  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  MRI is reviewed and does not reveal significant structural abnormality.  Therapeutic strategies for ventricular tachycardia/ PVCs including medicine and ablation were discussed in detail with the patient today. Risk, benefits, and alternatives to EP study and radiofrequency ablation were also discussed in detail today. These risks include  but are not limited to stroke, bleeding, vascular damage, tamponade, perforation, damage to the heart and other structures, AV block requiring pacemaker, worsening renal function, and death. The patient understands these risk and wishes to proceed.  We will therefore proceed with catheter ablation with anesthesia and Carto at the next available time.  Will hold flecainide/ beta blockers for 72 hours prior to the procedure.  Current medicines are reviewed at length with the patient today.   The patient does not have concerns regarding her medicines.  The following changes were made today:  none  Labs/ tests ordered today include:  Orders Placed This Encounter  Procedures  . Basic metabolic panel  . CBC with Differential  . EKG 12-Lead     Signed, Hillis Range, MD  08/02/2015 10:17 PM     [facility] [address] [place] [address] [phone] (office) [phone] (fax)

## 2015-08-04 ENCOUNTER — Encounter: Payer: Self-pay | Admitting: Internal Medicine

## 2015-08-13 ENCOUNTER — Other Ambulatory Visit: Payer: Self-pay | Admitting: Internal Medicine

## 2015-08-24 ENCOUNTER — Encounter: Payer: Self-pay | Admitting: *Deleted

## 2015-08-24 ENCOUNTER — Other Ambulatory Visit: Payer: Self-pay | Admitting: *Deleted

## 2015-08-24 DIAGNOSIS — I472 Ventricular tachycardia, unspecified: Secondary | ICD-10-CM

## 2015-09-20 ENCOUNTER — Other Ambulatory Visit: Payer: Self-pay | Admitting: *Deleted

## 2015-09-20 ENCOUNTER — Other Ambulatory Visit (INDEPENDENT_AMBULATORY_CARE_PROVIDER_SITE_OTHER): Payer: Managed Care, Other (non HMO) | Admitting: *Deleted

## 2015-09-20 DIAGNOSIS — I493 Ventricular premature depolarization: Secondary | ICD-10-CM | POA: Diagnosis not present

## 2015-09-20 DIAGNOSIS — I472 Ventricular tachycardia, unspecified: Secondary | ICD-10-CM

## 2015-09-20 LAB — CBC WITH DIFFERENTIAL/PLATELET
BASOS ABS: 0 10*3/uL (ref 0.0–0.1)
Basophils Relative: 0 % (ref 0–1)
Eosinophils Absolute: 0.2 10*3/uL (ref 0.0–0.7)
Eosinophils Relative: 3 % (ref 0–5)
HCT: 40.9 % (ref 36.0–46.0)
Hemoglobin: 14 g/dL (ref 12.0–15.0)
LYMPHS PCT: 47 % — AB (ref 12–46)
Lymphs Abs: 3 10*3/uL (ref 0.7–4.0)
MCH: 31.5 pg (ref 26.0–34.0)
MCHC: 34.2 g/dL (ref 30.0–36.0)
MCV: 92.1 fL (ref 78.0–100.0)
MPV: 11.3 fL (ref 8.6–12.4)
Monocytes Absolute: 0.6 10*3/uL (ref 0.1–1.0)
Monocytes Relative: 10 % (ref 3–12)
NEUTROS ABS: 2.5 10*3/uL (ref 1.7–7.7)
NEUTROS PCT: 40 % — AB (ref 43–77)
PLATELETS: 162 10*3/uL (ref 150–400)
RBC: 4.44 MIL/uL (ref 3.87–5.11)
RDW: 12.3 % (ref 11.5–15.5)
WBC: 6.3 10*3/uL (ref 4.0–10.5)

## 2015-09-20 LAB — BASIC METABOLIC PANEL
BUN: 14 mg/dL (ref 7–25)
CO2: 29 mmol/L (ref 20–31)
Calcium: 9 mg/dL (ref 8.6–10.2)
Chloride: 105 mmol/L (ref 98–110)
Creat: 0.84 mg/dL (ref 0.50–1.10)
Glucose, Bld: 90 mg/dL (ref 65–99)
POTASSIUM: 3.7 mmol/L (ref 3.5–5.3)
SODIUM: 138 mmol/L (ref 135–146)

## 2015-09-20 NOTE — Addendum Note (Signed)
Addended by: Tonita Phoenix on: 09/20/2015 03:16 PM   Modules accepted: Orders

## 2015-09-20 NOTE — Addendum Note (Signed)
Addended by: Tonita Phoenix on: 09/20/2015 03:13 PM   Modules accepted: Orders

## 2015-09-20 NOTE — Addendum Note (Signed)
Addended by: Tonita Phoenix on: 09/20/2015 03:15 PM   Modules accepted: Orders

## 2015-09-20 NOTE — Addendum Note (Signed)
Addended by: Tonita Phoenix on: 09/20/2015 04:33 PM   Modules accepted: Orders

## 2015-09-21 LAB — HCG, SERUM, QUALITATIVE: PREG SERUM: NEGATIVE

## 2015-09-27 ENCOUNTER — Encounter (HOSPITAL_COMMUNITY): Payer: Self-pay | Admitting: Anesthesiology

## 2015-09-27 ENCOUNTER — Ambulatory Visit (HOSPITAL_COMMUNITY): Payer: Managed Care, Other (non HMO) | Admitting: Anesthesiology

## 2015-09-27 ENCOUNTER — Ambulatory Visit (HOSPITAL_COMMUNITY)
Admission: RE | Admit: 2015-09-27 | Discharge: 2015-09-27 | Disposition: A | Discharge status: Home / Self Care | Payer: Managed Care, Other (non HMO) | Source: Ambulatory Visit | Attending: Internal Medicine | Admitting: Internal Medicine

## 2015-09-27 ENCOUNTER — Encounter (HOSPITAL_COMMUNITY)
Admission: RE | Disposition: A | Discharge status: Home / Self Care | Payer: Self-pay | Source: Ambulatory Visit | Attending: Internal Medicine

## 2015-09-27 ENCOUNTER — Ambulatory Visit (HOSPITAL_COMMUNITY): Admit: 2015-09-27 | Payer: Self-pay | Admitting: Internal Medicine

## 2015-09-27 DIAGNOSIS — I493 Ventricular premature depolarization: Secondary | ICD-10-CM | POA: Insufficient documentation

## 2015-09-27 DIAGNOSIS — R000 Tachycardia, unspecified: Secondary | ICD-10-CM

## 2015-09-27 DIAGNOSIS — I472 Ventricular tachycardia: Secondary | ICD-10-CM | POA: Insufficient documentation

## 2015-09-27 DIAGNOSIS — I471 Supraventricular tachycardia: Secondary | ICD-10-CM | POA: Diagnosis not present

## 2015-09-27 HISTORY — PX: ELECTROPHYSIOLOGIC STUDY: SHX172A

## 2015-09-27 LAB — PREGNANCY, URINE: PREG TEST UR: NEGATIVE

## 2015-09-27 SURGERY — V TACH ABLATION

## 2015-09-27 SURGERY — V TACH ABLATION
Anesthesia: Monitor Anesthesia Care

## 2015-09-27 MED ORDER — FENTANYL CITRATE (PF) 100 MCG/2ML IJ SOLN
INTRAMUSCULAR | Status: DC | PRN
  Administered 2015-09-27: 25 ug via INTRAVENOUS
  Administered 2015-09-27 (×2): 50 ug via INTRAVENOUS

## 2015-09-27 MED ORDER — PROPOFOL 500 MG/50ML IV EMUL
INTRAVENOUS | Status: DC | PRN
  Administered 2015-09-27: 75 ug/kg/min via INTRAVENOUS

## 2015-09-27 MED ORDER — SODIUM CHLORIDE 0.9 % IJ SOLN: 3.0000 mL | Freq: Two times a day (BID) | INTRAMUSCULAR | Status: DC

## 2015-09-27 MED ORDER — HYDROCODONE-ACETAMINOPHEN 5-325 MG PO TABS: 1.0000 | ORAL_TABLET | ORAL | Status: DC | PRN

## 2015-09-27 MED ORDER — HEPARIN SODIUM (PORCINE) 1000 UNIT/ML IJ SOLN
INTRAMUSCULAR | Status: DC | PRN
  Administered 2015-09-27: 1000 [IU] via INTRAVENOUS

## 2015-09-27 MED ORDER — LIDOCAINE HCL (CARDIAC) 20 MG/ML IV SOLN
INTRAVENOUS | Status: DC | PRN
  Administered 2015-09-27: 10 mg via INTRAVENOUS
  Administered 2015-09-27: 30 mg via INTRAVENOUS

## 2015-09-27 MED ORDER — SODIUM CHLORIDE 0.9 % IV SOLN
INTRAVENOUS | Status: DC | PRN
  Administered 2015-09-27: 07:00:00 via INTRAVENOUS

## 2015-09-27 MED ORDER — SODIUM CHLORIDE 0.9 % IV SOLN
2.0000 ug/min | INTRAVENOUS | Status: AC
  Administered 2015-09-27: 1 ug/min via INTRAVENOUS
  Filled 2015-09-27: qty 2

## 2015-09-27 MED ORDER — PROPOFOL 10 MG/ML IV BOLUS
INTRAVENOUS | Status: DC | PRN
  Administered 2015-09-27: 10 mg via INTRAVENOUS
  Administered 2015-09-27: 20 mg via INTRAVENOUS
  Administered 2015-09-27: 10 mg via INTRAVENOUS

## 2015-09-27 MED ORDER — SODIUM CHLORIDE 0.9 % IJ SOLN: 3.0000 mL | INTRAMUSCULAR | Status: DC | PRN

## 2015-09-27 MED ORDER — HEPARIN SODIUM (PORCINE) 1000 UNIT/ML IJ SOLN
INTRAMUSCULAR | Status: AC
  Filled 2015-09-27: qty 1

## 2015-09-27 MED ORDER — SODIUM CHLORIDE 0.9 % IV SOLN: 250.0000 mL | INTRAVENOUS | Status: DC | PRN

## 2015-09-27 MED ORDER — BUPIVACAINE HCL (PF) 0.25 % IJ SOLN
INTRAMUSCULAR | Status: DC | PRN
  Administered 2015-09-27: 15 mL

## 2015-09-27 MED ORDER — BUPIVACAINE HCL (PF) 0.25 % IJ SOLN
INTRAMUSCULAR | Status: AC
  Filled 2015-09-27: qty 30

## 2015-09-27 MED ORDER — MIDAZOLAM HCL 5 MG/5ML IJ SOLN
INTRAMUSCULAR | Status: DC | PRN
  Administered 2015-09-27: 2 mg via INTRAVENOUS

## 2015-09-27 MED ORDER — HEPARIN (PORCINE) IN NACL 2-0.9 UNIT/ML-% IJ SOLN
INTRAMUSCULAR | Status: DC | PRN
  Administered 2015-09-27: 09:00:00

## 2015-09-27 SURGICAL SUPPLY — 13 items
BLANKET WARM UNDERBOD FULL ACC (MISCELLANEOUS) ×2 IMPLANT
CATH JOSEPH QUAD ALLRED 6F REP (CATHETERS) ×1 IMPLANT
CATH WEBSTER BI DIR CS D-F CRV (CATHETERS) ×2 IMPLANT
PACK EP LATEX FREE (CUSTOM PROCEDURE TRAY) ×2
PACK EP LF (CUSTOM PROCEDURE TRAY) ×1 IMPLANT
PAD DEFIB LIFELINK (PAD) ×2 IMPLANT
PATCH CARTO3 (PAD) ×2 IMPLANT
SHEATH PINNACLE 6F 10CM (SHEATH) ×2 IMPLANT
SHEATH PINNACLE 7F 10CM (SHEATH) ×2 IMPLANT
SHEATH PINNACLE 8F 10CM (SHEATH) ×2 IMPLANT
SHIELD RADPAD SCOOP 12X17 (MISCELLANEOUS) ×2 IMPLANT
TRANSDUCER W/STOPCOCK (MISCELLANEOUS) ×1 IMPLANT
TUBING SMART ABLATE COOLFLOW (TUBING) ×2 IMPLANT

## 2015-09-27 NOTE — Anesthesia Preprocedure Evaluation (Signed)
Anesthesia Evaluation  Patient identified by MRN, date of birth, ID band Patient awake    Reviewed: Allergy & Precautions, NPO status , Patient's Chart, lab work & pertinent test results  Airway Mallampati: II  TM Distance: >3 FB Neck ROM: Full    Dental no notable dental hx.    Pulmonary neg pulmonary ROS,    Pulmonary exam normal breath sounds clear to auscultation       Cardiovascular negative cardio ROS Normal cardiovascular exam Rhythm:Regular Rate:Normal     Neuro/Psych negative neurological ROS  negative psych ROS   GI/Hepatic negative GI ROS, Neg liver ROS,   Endo/Other  negative endocrine ROS  Renal/GU negative Renal ROS     Musculoskeletal negative musculoskeletal ROS (+)   Abdominal   Peds  Hematology negative hematology ROS (+)   Anesthesia Other Findings   Reproductive/Obstetrics negative OB ROS                             Anesthesia Physical Anesthesia Plan  ASA: II  Anesthesia Plan: MAC   Post-op Pain Management:    Induction: Intravenous  Airway Management Planned:   Additional Equipment:   Intra-op Plan:   Post-operative Plan: Extubation in OR  Informed Consent: I have reviewed the patients History and Physical, chart, labs and discussed the procedure including the risks, benefits and alternatives for the proposed anesthesia with the patient or authorized representative who has indicated his/her understanding and acceptance.   Dental advisory given  Plan Discussed with: CRNA  Anesthesia Plan Comments:         Anesthesia Quick Evaluation

## 2015-09-27 NOTE — Progress Notes (Signed)
Site area: 3 rt fv sheaths Site Prior to Removal:  Level  0 Pressure Applied For:  15 minutes Manual:   yes Patient Status During Pull:  stable Post Pull Site:  Level  0 Post Pull Instructions Given:  yes Post Pull Pulses Present:  Yes  Dressing Applied:  Small tegaderm Bedrest begins @  0955 Comments:  IV saline locked

## 2015-09-27 NOTE — Discharge Instructions (Signed)

## 2015-09-27 NOTE — Anesthesia Postprocedure Evaluation (Signed)
Anesthesia Post Note  Patient: Heather Rice  Procedure(s) Performed: Procedure(s) (LRB): PVC Ablation (N/A)  Patient location during evaluation: PACU Anesthesia Type: MAC Level of consciousness: awake and alert Pain management: pain level controlled Vital Signs Assessment: post-procedure vital signs reviewed and stable Respiratory status: spontaneous breathing Cardiovascular status: stable Anesthetic complications: no    Last Vitals:  Filed Vitals:   09/27/15 1000 09/27/15 1039  BP: 108/57 112/64  Pulse: 67 69  Temp:  36.8 C  Resp: 14     Last Pain: There were no vitals filed for this visit.               Lewie Loron

## 2015-09-27 NOTE — H&P (Signed)
  Chief Complaint  Patient presents with  . Palpitations  . VT    History of Present Illness: Heather Rice is a 39 y.o. female who presents today for EPS and ablation of VT. She has a h/o VT, NSVT, and PVCs. She has been managed by Dr Graciela Husbands for several years. Though she has done very well with flecainide and beta blocker therapy, she reports fatigue with beta blockers and occasional breakthrough episodes.  She presents today for ablation.  She has been off of medicine for 3 days. Today, she denies symptoms of chest pain, shortness of breath, orthopnea, PND, lower extremity edema, claudication, dizziness, presyncope, syncope, bleeding, or neurologic sequela. The patient is tolerating medications without difficulties and is otherwise without complaint today.    Past Medical History  Diagnosis Date  . VT (ventricular tachycardia) (HCC)    No past surgical history on file.   Current Outpatient Prescriptions  Medication Sig Dispense Refill  . cholecalciferol (VITAMIN D) 1000 UNITS tablet Take 1,000 Units by mouth daily.    . flecainide (TAMBOCOR) 100 MG tablet TAKE 1 TABLET (100 MG TOTAL) BY MOUTH 2 (TWO) TIMES DAILY. 180 tablet 3  . levonorgestrel (MIRENA) 20 MCG/24HR IUD 1 each by Intrauterine route as directed.    . metoprolol (LOPRESSOR) 50 MG tablet Take 50 mg by mouth daily.     No current facility-administered medications for this visit.    Allergies: Review of patient's allergies indicates no known allergies.   Social History: The patient  reports that she has never smoked. She does not have any smokeless tobacco history on file. She reports that she does not drink alcohol or use illicit drugs.   Family History: The patient denies FH of arrhythmia   ROS: Please see the history of present illness. All other systems are reviewed and negative.    PHYSICAL EXAM: Filed Vitals:   09/27/15 0535  BP: 125/84  Pulse: 81   Temp: 98.1 F (36.7 C)  Resp: 18   GEN: Well nourished, well developed, in no acute distress  HEENT: normal  Neck: no JVD, carotid bruits, or masses Cardiac: RRR; no murmurs, rubs, or gallops,no edema  Respiratory: clear to auscultation bilaterally, normal work of breathing GI: soft, nontender, nondistended, + BS MS: no deformity or atrophy  Skin: warm and dry  Neuro: Strength and sensation are intact Psych: euthymic mood, full affect    ASSESSMENT AND PLAN:  1. PVCs/ VT The patient has a h/o recurrent symptomatic VT. She has been treated with flecainide and metoprolol as above. The majority of her ekgs reveal sinus rhythm. 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 MRI is reviewed and does not reveal significant structural abnormality.  Therapeutic strategies for ventricular tachycardia/ PVCs including medicine and ablation were discussed in detail with the patient today. Risk, benefits, and alternatives to EP study and radiofrequency ablation were also discussed in detail today. These risks include but are not limited to stroke, bleeding, vascular damage, tamponade, perforation, damage to the heart and other structures, AV block requiring pacemaker, worsening renal function, and death. The patient understands these risk and wishes to proceed at this time.   Hillis Range MD, [facility] 09/27/2015 7:23 AM

## 2015-09-27 NOTE — Transfer of Care (Signed)
Immediate Anesthesia Transfer of Care Note  Patient: Heather Rice  Procedure(s) Performed: Procedure(s): PVC Ablation (N/A)  Patient Location: Cath Lab  Anesthesia Type:MAC  Level of Consciousness: awake, alert  and oriented  Airway & Oxygen Therapy: Patient Spontanous Breathing  Post-op Assessment: Report given to RN, Post -op Vital signs reviewed and stable and Patient moving all extremities  Post vital signs: Reviewed and stable  Last Vitals:  Filed Vitals:   09/27/15 0911 09/27/15 0912  BP:    Pulse: 72 76  Temp:    Resp: 12 17    Complications: No apparent anesthesia complications

## 2015-09-28 ENCOUNTER — Encounter (HOSPITAL_COMMUNITY): Payer: Self-pay | Admitting: Internal Medicine

## 2015-10-26 ENCOUNTER — Encounter: Payer: Self-pay | Admitting: Internal Medicine

## 2015-10-26 ENCOUNTER — Ambulatory Visit (INDEPENDENT_AMBULATORY_CARE_PROVIDER_SITE_OTHER): Payer: Managed Care, Other (non HMO) | Admitting: Internal Medicine

## 2015-10-26 VITALS — BP 102/68 | HR 64 | Ht 69.0 in | Wt 184.8 lb

## 2015-10-26 DIAGNOSIS — I472 Ventricular tachycardia, unspecified: Secondary | ICD-10-CM

## 2015-10-26 NOTE — Progress Notes (Signed)
Electrophysiology Office Note   Date:  10/26/2015   ID:  Heather Rice, DOB November 04, 1975, MRN [id number]  PCP:  Delbert Harness, MD   Primary Electrophysiologist: Dr Graciela Husbands  Chief Complaint  Patient presents with  . PVCs  . VT     History of Present Illness: Heather Rice is a 40 y.o. female who presents today for electrophysiology evaluation.  She has a h/o VT, NSVT, and PVCs.  She has been managed by Dr Graciela Husbands for several years.  Though she has done very well with flecainide and beta blocker therapy, she reports fatigue with beta blockers and occasional breakthrough episodes.  I saw her and elected to perform EP study 09/27/15.  Unfortunately, she had no inducible VT and PVCs were too infrequent for mapping.  She presents today for follow-up. She denies procedure related complication.  Palpitations are infrequent and stable.  She has stopped her beta blocker and finds that her fatigue and daytime somnolence is "better".  Today, she denies symptoms of  chest pain, shortness of breath, orthopnea, PND, lower extremity edema, claudication, dizziness, presyncope, syncope, bleeding, or neurologic sequela. The patient is tolerating medications without difficulties and is otherwise without complaint today.    Past Medical History  Diagnosis Date  . VT (ventricular tachycardia) [facility])    Past Surgical History  Procedure Laterality Date  . Electrophysiologic study N/A 09/27/2015    Procedure: PVC Ablation;  Surgeon: Hillis Range, MD;  Location: [facility] INVASIVE CV LAB;  Service: Cardiovascular;  Laterality: N/A;     Current Outpatient Prescriptions  Medication Sig Dispense Refill  . cholecalciferol (VITAMIN D) 1000 UNITS tablet Take 1,000 Units by mouth daily.    . flecainide (TAMBOCOR) 100 MG tablet TAKE 1 TABLET (100 MG TOTAL) BY MOUTH 2 (TWO) TIMES DAILY. (Patient taking differently: Take 100 mg by mouth 2 (two) times daily. ) 180 tablet 3  . ibuprofen (ADVIL,MOTRIN) 200 MG tablet Take 200  mg by mouth daily as needed for headache.    . levonorgestrel (MIRENA) 20 MCG/24HR IUD 1 each by Intrauterine route once. Implanted April 2013     No current facility-administered medications for this visit.    Allergies:   Review of patient's allergies indicates no known allergies.   Social History:  The patient  reports that she has never smoked. She does not have any smokeless tobacco history on file. She reports that she does not drink alcohol or use illicit drugs.   Family History:  The patient denies FH of arrhythmia   ROS:  Please see the history of present illness.   All other systems are reviewed and negative.    PHYSICAL EXAM: VS:  BP 102/68 mmHg  Pulse 64  Ht  (1.753 m)  Wt 184 lb 12.8 oz (83.825 kg)  BMI 27.28 kg/m2 , BMI Body mass index is 27.28 kg/(m^2). GEN: Well nourished, well developed, in no acute distress HEENT: normal Neck: no JVD, carotid bruits, or masses Cardiac: RRR; no murmurs, rubs, or gallops,no edema  Respiratory:  clear to auscultation bilaterally, normal work of breathing GI: soft, nontender, nondistended, + BS MS: no deformity or atrophy Skin: warm and dry  Neuro:  Strength and sensation are intact Psych: euthymic mood, full affect  EKG:  EKG is ordered today. The ekg ordered today shows sinus rhythm, no PVCs, nonspecific St/T changes   Recent Labs: 04/16/2015: ALT 14; Magnesium 1.7 09/20/2015: BUN 14; Creat 0.84; Hemoglobin 14.0; Platelets 162; Potassium 3.7; Sodium 138    Lipid Panel  No results found for: CHOL, TRIG, HDL, CHOLHDL, VLDL, LDLCALC, LDLDIRECT   Wt Readings from Last 3 Encounters:  10/26/15 184 lb 12.8 oz (83.825 kg)  09/27/15 178 lb (80.74 kg)  08/01/15 182 lb (82.555 kg)     ASSESSMENT AND PLAN:  1.  PVCs/ VT Stable Today, we discussed weaning flecainide and considering ablation with recurrence of her VT vs continuing her medicine with watchful waiting. She is reluctant to make any additional changes at this  time. She will follow-up with Dr Graciela Husbands and I will see when needed going forward    Signed, Hillis Range, MD  10/26/2015 4:38 PM     [facility] [address] [place] [address] [phone] (office) [phone] (fax)

## 2015-10-26 NOTE — Patient Instructions (Signed)

## 2015-11-02 ENCOUNTER — Encounter: Payer: Self-pay | Admitting: Internal Medicine

## 2016-08-18 ENCOUNTER — Other Ambulatory Visit: Payer: Self-pay | Admitting: Internal Medicine

## 2016-11-17 ENCOUNTER — Other Ambulatory Visit: Payer: Self-pay | Admitting: Internal Medicine

## 2016-12-21 ENCOUNTER — Encounter: Payer: Self-pay | Admitting: Physician Assistant

## 2016-12-22 ENCOUNTER — Other Ambulatory Visit: Payer: Self-pay | Admitting: Internal Medicine

## 2016-12-26 ENCOUNTER — Telehealth: Payer: Self-pay | Admitting: Internal Medicine

## 2016-12-26 MED ORDER — FLECAINIDE ACETATE 100 MG PO TABS: 100.0000 mg | ORAL_TABLET | Freq: Two times a day (BID) | ORAL | 1 refills | Status: DC

## 2016-12-26 NOTE — Telephone Encounter (Signed)
Follow up   Pt states she took last pill this morning. Pt states her insurance wouldn't cover the monthly so she needs 90 days.    *STAT* If patient is at the pharmacy, call can be transferred to refill team.   1. Which medications need to be refilled? (please list name of each medication and dose if known) flecainide 100 mg  2. Which pharmacy/location (including street and city if local pharmacy) is medication to be sent to? [facility] [address] [facility] [facility] [place]  3. Do they need a 30 day or 90 day supply? 90 day

## 2016-12-26 NOTE — Telephone Encounter (Signed)
New message    Per pt she needs 90 day supply due to insurance, and that's all they will cover.  *STAT* If patient is at the pharmacy, call can be transferred to refill team.   1. Which medications need to be refilled? (please list name of each medication and dose if known)   flecainide (TAMBOCOR) 100 MG tablet   2. Which pharmacy/location (including street and city if local pharmacy) is medication to be sent to? [id number] - [place], [place] - [address] 3. Do they need a 30 day or 90 day supply? 90 day

## 2016-12-27 NOTE — Progress Notes (Signed)
Cardiology Office Note Date:  12/28/2016  Patient ID:  Heather Rice, DOB 08/23/1976, MRN [id number] PCP:  Delbert Harness, MD  Electrophysiologist: Dr. Graciela Husbands   Chief Complaint: annual visit, medicine refills  History of Present Illness: Heather Rice is a 41 y.o. female with history of VT, NSVT, PVC's, treated with BB and Flecainide, she was referred to Dr. Johney Frame and underwent EPS without inducible VT, subsequently at her last office visit, a f/u with Dr. Johney Frame, in discussion of tretment options (weaning Flecainide and consider ablation if recurrent VT or continuing meds and watchful waiting), watchful waiting and ongoing medical therapy with Flecainide was decided.  She comes today to be seen for Dr. Graciela Husbands for a routine, planned annual visit, she is feeling well, has not had palpitations, no dizziness, near syncope or syncope, no CP or SOB.  She works at a desk all day and when she stands up after long periods of sitting feels like her legs are weak or tired and worries about PVD.  Mentions she doesn't exercise worried about provoking a tachycardia.  She mentions an uncle with PVD and would like her cholesterol checked.   Past Medical History:  Diagnosis Date  . VT (ventricular tachycardia) (HCC)     Past Surgical History:  Procedure Laterality Date  . ELECTROPHYSIOLOGIC STUDY N/A 09/27/2015   Procedure: PVC Ablation;  Surgeon: Hillis Range, MD;  Location: [facility] INVASIVE CV LAB;  Service: Cardiovascular;  Laterality: N/A;    Current Outpatient Prescriptions  Medication Sig Dispense Refill  . cholecalciferol (VITAMIN D) 1000 UNITS tablet Take 1,000 Units by mouth daily.    . flecainide (TAMBOCOR) 100 MG tablet Take 1 tablet (100 mg total) by mouth 2 (two) times daily. 180 tablet 1  . ibuprofen (ADVIL,MOTRIN) 200 MG tablet Take 200 mg by mouth daily as needed for headache.    . levonorgestrel (MIRENA) 20 MCG/24HR IUD 1 each by Intrauterine route once. Implanted April 2013     No  current facility-administered medications for this visit.     Allergies:   Patient has no known allergies.   Social History:  The patient  reports that she has never smoked. She has never used smokeless tobacco. She reports that she does not drink alcohol or use drugs.   Family History:  The patient's family history includes Cancer in her paternal grandfather; Diabetes in her maternal grandmother and mother.  ROS:  Please see the history of present illness.  All other systems are reviewed and otherwise negative.   PHYSICAL EXAM:  VS:  BP 111/75   Pulse (!) 58   Ht 5\' 9"  (1.753 m)   Wt 180 lb (81.6 kg)   BMI 26.58 kg/m  BMI: Body mass index is 26.58 kg/m. Well nourished, well developed, in no acute distress  HEENT: normocephalic, atraumatic  Neck: no JVD, carotid bruits or masses Cardiac:   RRR; no significant murmurs, no rubs, or gallops Lungs:  CTA b/l, no wheezing, rhonchi or rales  Abd: soft, nontender MS: no deformity or atrophy Ext:  no edema,war, with excellent b/l pedal pulses and brisk cap refill, no skin changes or calf tenderness Skin: warm and dry, no rash Neuro:  No gross deficits appreciated Psych: euthymic mood, full affect    EKG:  Done today shows SB, 58bpm, PR , QRS 90ms, QTc , nonspecific T changes look similar to previous  03/30/14: TTE LVEF 60-65% No significant valve disease  02/17/13: Stress myoview Normal myocardial perfusion study   Recent Labs: No  results found for requested labs within last 8760 hours.  No results found for requested labs within last 8760 hours.   CrCl cannot be calculated (Patient's most recent lab result is older than the maximum 21 days allowed.).   Wt Readings from Last 3 Encounters:  12/28/16 180 lb (81.6 kg)  10/26/15 184 lb 12.8 oz (83.8 kg)  09/27/15 178 lb (80.7 kg)     Other studies reviewed: Additional studies/records reviewed today include: summarized above  ASSESSMENT AND PLAN:  1. VT      Doing well on Flecainide, will refill, EKG looks OK  2. 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 Graciela Husbands in 1 year, sooner if needed.  Current medicines are reviewed at length with the patient today.  The patient did not have any concerns regarding medicines.  Judith Blonder, PA-C 12/28/2016 6:43 PM     [facility] [address] [place] [address] [phone] (office)  [phone] (fax)

## 2016-12-28 ENCOUNTER — Encounter: Payer: Self-pay | Admitting: Physician Assistant

## 2016-12-28 ENCOUNTER — Ambulatory Visit (INDEPENDENT_AMBULATORY_CARE_PROVIDER_SITE_OTHER): Payer: 59 | Admitting: Physician Assistant

## 2016-12-28 VITALS — BP 111/75 | HR 58 | Ht 69.0 in | Wt 180.0 lb

## 2016-12-28 DIAGNOSIS — I472 Ventricular tachycardia, unspecified: Secondary | ICD-10-CM

## 2016-12-28 DIAGNOSIS — Z79899 Other long term (current) drug therapy: Secondary | ICD-10-CM

## 2016-12-28 DIAGNOSIS — Z13220 Encounter for screening for lipoid disorders: Secondary | ICD-10-CM | POA: Diagnosis not present

## 2016-12-28 NOTE — Patient Instructions (Signed)
Medication Instructions:    Your physician recommends that you continue on your current medications as directed. Please refer to the Current Medication list given to you today.    If you need a refill on your cardiac medications before your next appointment, please call your pharmacy.  Labwork:  BMET AND LIPIDS TODAY    Testing/Procedures: NONE ORDERED  TODAY    Follow-Up:  Your physician wants you to follow-up in: ONE YEAR WITH Graciela Husbands   You will receive a reminder letter in the mail two months in advance. If you don't receive a letter, please call our office to schedule the follow-up appointment.     Any Other Special Instructions Will Be Listed Below (If Applicable).

## 2016-12-29 LAB — LIPID PANEL
CHOL/HDL RATIO: 2.7 ratio (ref 0.0–4.4)
CHOLESTEROL TOTAL: 188 mg/dL (ref 100–199)
HDL: 69 mg/dL (ref >39)
LDL Calculated: 104 mg/dL — ABNORMAL HIGH (ref 0–99)
TRIGLYCERIDES: 73 mg/dL (ref 0–149)
VLDL Cholesterol Cal: 15 mg/dL (ref 5–40)

## 2016-12-29 LAB — BASIC METABOLIC PANEL
BUN/Creatinine Ratio: 11 (ref 9–23)
BUN: 8 mg/dL (ref 6–20)
CALCIUM: 9.6 mg/dL (ref 8.7–10.2)
CHLORIDE: 102 mmol/L (ref 96–106)
CO2: 26 mmol/L (ref 18–29)
Creatinine, Ser: 0.74 mg/dL (ref 0.57–1.00)
GFR calc Af Amer: 119 mL/min/{1.73_m2} (ref >59)
GFR calc non Af Amer: 103 mL/min/{1.73_m2} (ref >59)
Glucose: 93 mg/dL (ref 65–99)
POTASSIUM: 4.1 mmol/L (ref 3.5–5.2)
SODIUM: 142 mmol/L (ref 134–144)

## 2017-06-14 ENCOUNTER — Other Ambulatory Visit: Payer: Self-pay | Admitting: Internal Medicine

## 2017-08-27 DIAGNOSIS — B001 Herpesviral vesicular dermatitis: Secondary | ICD-10-CM | POA: Insufficient documentation

## 2017-08-28 LAB — HEMOGLOBIN A1C: HEMOGLOBIN A1C: 4.8

## 2017-08-28 LAB — CBC AND DIFFERENTIAL
HCT: 43 (ref 36–46)
HEMOGLOBIN: 14.9 (ref 12.0–16.0)
Neutrophils Absolute: 4
PLATELETS: 172 (ref 150–399)
WBC: 7

## 2017-08-28 LAB — HEPATIC FUNCTION PANEL
ALT: 14 (ref 7–35)
AST: 15 (ref 13–35)
Alkaline Phosphatase: 64 (ref 25–125)
Bilirubin, Total: 0.3

## 2017-08-28 LAB — BASIC METABOLIC PANEL
BUN: 12 (ref 4–21)
CREATININE: 0.8 (ref 0.5–1.1)
Glucose: 75
Potassium: 4.6 (ref 3.4–5.3)
Sodium: 141 (ref 137–147)

## 2017-08-28 LAB — TSH: TSH: 2.6 (ref 0.41–5.90)

## 2017-12-20 ENCOUNTER — Other Ambulatory Visit: Payer: Self-pay | Admitting: Internal Medicine

## 2017-12-23 DIAGNOSIS — J1089 Influenza due to other identified influenza virus with other manifestations: Secondary | ICD-10-CM | POA: Diagnosis not present

## 2018-01-10 DIAGNOSIS — D2339 Other benign neoplasm of skin of other parts of face: Secondary | ICD-10-CM | POA: Diagnosis not present

## 2018-01-10 DIAGNOSIS — L918 Other hypertrophic disorders of the skin: Secondary | ICD-10-CM | POA: Diagnosis not present

## 2018-01-10 DIAGNOSIS — L821 Other seborrheic keratosis: Secondary | ICD-10-CM | POA: Diagnosis not present

## 2018-01-16 ENCOUNTER — Other Ambulatory Visit: Payer: Self-pay | Admitting: Internal Medicine

## 2018-01-16 MED ORDER — FLECAINIDE ACETATE 100 MG PO TABS: 100.0000 mg | ORAL_TABLET | Freq: Two times a day (BID) | ORAL | 0 refills | Status: DC

## 2018-03-05 ENCOUNTER — Other Ambulatory Visit: Payer: Self-pay | Admitting: Internal Medicine

## 2018-04-01 ENCOUNTER — Telehealth: Payer: Self-pay | Admitting: Family Medicine

## 2018-04-01 ENCOUNTER — Other Ambulatory Visit: Payer: Self-pay | Admitting: Internal Medicine

## 2018-04-01 MED ORDER — FLECAINIDE ACETATE 100 MG PO TABS: ORAL_TABLET | ORAL | 0 refills | Status: DC

## 2018-04-01 NOTE — Telephone Encounter (Signed)
Please help call the pt and offer an appt.   Thank you Cindee Lame

## 2018-04-01 NOTE — Telephone Encounter (Signed)
Okay to work in as requested 

## 2018-04-01 NOTE — Telephone Encounter (Signed)
New message    *STAT* If patient is at the pharmacy, call can be transferred to refill team.   1. Which medications need to be refilled? (please list name of each medication and dose if known) flecainide (TAMBOCOR) 100 MG tablet  2. Which pharmacy/location (including street and city if local pharmacy) is medication to be sent to? [facility] - [place], [place] - [address]  3. Do they need a 30 day or 90 day supply? 90

## 2018-04-01 NOTE — Telephone Encounter (Signed)
Pt's medication was sent to pt's pharmacy as requested. Confirmation received.  °

## 2018-04-01 NOTE — Telephone Encounter (Signed)
Dr. Dayton Martes please advise ok to work the pt in, 06/02/2018 you already have 1 new pt and 2 CPE.      Copied from CRM [id number]. Topic: Inquiry >> Apr 01, 2018  3:07 PM Heather Rice wrote: Reason for CRM: pt really wants to be a pt. Of Dr. Dayton Martes.  Her first appt is in Sept.  She is asking if Dr Dayton Martes would see her on Aug. 19th, she is off this day.

## 2018-04-16 NOTE — Telephone Encounter (Signed)
Pt is aware of 06-02-18 appt

## 2018-05-30 ENCOUNTER — Encounter: Payer: Self-pay | Admitting: Family Medicine

## 2018-05-30 ENCOUNTER — Other Ambulatory Visit: Payer: Self-pay

## 2018-05-30 NOTE — Progress Notes (Signed)
[facility]/thx dmf 

## 2018-06-01 NOTE — Progress Notes (Signed)
Cardiology Office Note Date:  06/02/2018  Patient ID:  Heather, Rice [birth year], MRN [id number] PCP:  Dianne Dun, MD  Electrophysiologist: Dr. Graciela Husbands   Chief Complaint:  annual visit, medicine refills  History of Present Illness: Heather Rice is a 42 y.o. female with history of VT, NSVT, PVC's, treated with BB and Flecainide, she was referred to Dr. Johney Frame and underwent EPS without inducible VT, subsequently at her last office visit, a f/u with Dr. Johney Frame, (Jan 2017) in discussion of treatment options (weaning Flecainide and consider ablation if recurrent VT or continuing meds and watchful waiting), watchful waiting and ongoing medical therapy with Flecainide was decided.  I saw her in March 2018 for Dr. Graciela Husbands for a routine, planned annual visit, she is feeling well, no c/o palpitations, no dizziness, near syncope or syncope, no CP or SOB.  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  Mentioned she did not exercise being worried about provoking a tachycardia.  She mentioned an uncle with PVD and wanted her LE and cholesterol checked.  Her EKG noted stable intervals, she had no exam findings to suggest any PVD, recommended to avoid long periods of sitting and initiate walking for exercise.  Lipds looked OK.  She comes today for annual f/u.  She is doing well.  She mentions again feeling like her legs are heavy after prolonged periods of sitting.  Mentions that she and her husband drove to and back from [place], and when 1st getting out of the care her legs feel tired/heavy.  No pain, no swelling, no skin changes.  She has not had any palpitations, no CP, no SOB.  2 weeks ago while standing at work she had a split second of sudden dizziness.  None since.  She mentioned on a couple of occassions upon standing she feels transiently lightheaded, this different that momentary spell.  Also  states that she has found infrequently her BP 90's systolic and generally feels tired.  She is being evaluated for thyroid cysts, had labs done this AM.   No syncope.  She does not exercise.  She does not describe symptoms of claudication.    Past Medical History:  Diagnosis Date  . History of chicken pox   . Thyroid cyst 2004   After having her daughter but resolved  . VT (ventricular tachycardia) (HCC)     Past Surgical History:  Procedure Laterality Date  . ELECTROPHYSIOLOGIC STUDY N/A 09/27/2015   Procedure: PVC Ablation;  Surgeon: Hillis Range, MD;  Location: [facility] INVASIVE CV LAB;  Service: Cardiovascular;  Laterality: N/A;    Current Outpatient Medications  Medication Sig Dispense Refill  . cholecalciferol (VITAMIN D) 1000 UNITS tablet Take 1,000 Units by mouth daily.    . flecainide (TAMBOCOR) 100 MG tablet Take 1 tablet by mouth two times daily. Please keep upcoming appt in August. Final Attempt 180 tablet 0  . levonorgestrel (MIRENA) 20 MCG/24HR IUD 1 each by Intrauterine route once. Implanted April 2018    . valACYclovir (VALTREX) 1000 MG tablet Take 1 tablet by mouth 2 (two) times daily as needed.     No current facility-administered medications for this visit.     Allergies:   Patient has no known allergies.   Social History:  The patient  reports that she has never smoked. She has never used smokeless tobacco. She reports that she  does not drink alcohol or use drugs.   Family History:  The patient's family history includes Arthritis in her mother; Birth defects in her maternal grandfather; Cancer in her paternal grandfather; Diabetes in her maternal grandmother, mother, and paternal grandmother; Early death in her father.  ROS:  Please see the history of present illness.  All other systems are reviewed and otherwise negative.   PHYSICAL EXAM:  VS:  BP 118/64   Pulse 66   Ht 5\' 9"  (1.753 m)   Wt 181 lb (82.1 kg)   BMI 26.73 kg/m  BMI: Body mass index is 26.73  kg/m. Well nourished, well developed, in no acute distress  HEENT: normocephalic, atraumatic  Neck: no JVD, carotid bruits or masses Cardiac:   RRR; no significant murmurs, no rubs, or gallops Lungs:  CTA b/l, no wheezing, rhonchi or rales  Abd: soft, nontender MS: no deformity or atrophy Ext:  no edema,againwith excellent b/l pedal pulses and brisk cap refill, no skin changes or calf tenderness.  She has small very superficial spider veins L leg just inferior to knee she dates back to highschool. Skin: warm and dry, no rash Neuro:  No gross deficits appreciated Psych: euthymic mood, full affect    EKG:  Done today and reviewed by myself:  06/02/18: SR 66bpm, PR , QRS 96ms, QTc , nonspecific T changes, unchanged from prior 12/28/16: SB, 58bpm, PR , QRS 90ms, QTc , nonspecific T changes look similar to previous  03/30/14: TTE LVEF 60-65% No significant valve disease  02/17/13: Stress myoview Normal myocardial perfusion study   Recent Labs: 08/28/2017: ALT 14; BUN 12; Creatinine 0.8; Hemoglobin 14.9; Platelets 172; Potassium 4.6; Sodium 141; TSH 2.60  No results found for requested labs within last 8760 hours.   CrCl cannot be calculated (Patient's most recent lab result is older than the maximum 21 days allowed.).   Wt Readings from Last 3 Encounters:  06/02/18 181 lb (82.1 kg)  06/02/18 181 lb 3.2 oz (82.2 kg)  12/28/16 180 lb (81.6 kg)     Other studies reviewed: Additional studies/records reviewed today include: summarized above  ASSESSMENT AND PLAN:  1. VT     Doing well on Flecainide, stable intervals  She has a very fleeting/momentary strange feeling of lightheaded, lasted a second or less. Not recurrent  (2 weeks ago), she is asked to let us know if this becomes recurrent  Other symptom sounds of orthostatic, and she is encouraged to maintain adequate hydration  2. LE symptoms     She is worried about a problem with her veins/circulation.      Exam does not suggest PVD or DVT/venous insufficiency     Discussed venous US and ABI for thoroughness given she mentions this again today, she would like to but wants to put it off for now while her thyroid testing is underway   Disposition: She will call when ready to schedule LE evaluation, otherwise we will see her back in 1 year, sooner if needed.  Current medicines are reviewed at length with the patient today.  The patient did not have any concerns regarding medicines.  Judith Blonder, PA-C 06/02/2018 10:20 AM     [facility] [address] [place] [address] [phone] (office)  [phone] (fax)

## 2018-06-02 ENCOUNTER — Encounter: Payer: Self-pay | Admitting: Family Medicine

## 2018-06-02 ENCOUNTER — Ambulatory Visit (INDEPENDENT_AMBULATORY_CARE_PROVIDER_SITE_OTHER): Payer: BLUE CROSS/BLUE SHIELD | Admitting: Family Medicine

## 2018-06-02 ENCOUNTER — Ambulatory Visit (INDEPENDENT_AMBULATORY_CARE_PROVIDER_SITE_OTHER): Payer: BLUE CROSS/BLUE SHIELD | Admitting: Physician Assistant

## 2018-06-02 VITALS — BP 116/70 | HR 67 | Temp 99.3°F | Ht 70.25 in | Wt 181.2 lb

## 2018-06-02 VITALS — BP 118/64 | HR 66 | Ht 69.0 in | Wt 181.0 lb

## 2018-06-02 DIAGNOSIS — I472 Ventricular tachycardia, unspecified: Secondary | ICD-10-CM

## 2018-06-02 DIAGNOSIS — F410 Panic disorder [episodic paroxysmal anxiety] without agoraphobia: Secondary | ICD-10-CM | POA: Diagnosis not present

## 2018-06-02 DIAGNOSIS — Z1231 Encounter for screening mammogram for malignant neoplasm of breast: Secondary | ICD-10-CM | POA: Diagnosis not present

## 2018-06-02 DIAGNOSIS — E041 Nontoxic single thyroid nodule: Secondary | ICD-10-CM

## 2018-06-02 DIAGNOSIS — Z0000 Encounter for general adult medical examination without abnormal findings: Secondary | ICD-10-CM | POA: Diagnosis not present

## 2018-06-02 DIAGNOSIS — E079 Disorder of thyroid, unspecified: Secondary | ICD-10-CM

## 2018-06-02 DIAGNOSIS — Z8639 Personal history of other endocrine, nutritional and metabolic disease: Secondary | ICD-10-CM | POA: Diagnosis not present

## 2018-06-02 DIAGNOSIS — Z5181 Encounter for therapeutic drug level monitoring: Secondary | ICD-10-CM | POA: Diagnosis not present

## 2018-06-02 LAB — COMPREHENSIVE METABOLIC PANEL
ALBUMIN: 4.4 g/dL (ref 3.5–5.2)
ALT: 8 U/L (ref 0–35)
AST: 11 U/L (ref 0–37)
Alkaline Phosphatase: 58 U/L (ref 39–117)
BUN: 20 mg/dL (ref 6–23)
CALCIUM: 9.6 mg/dL (ref 8.4–10.5)
CHLORIDE: 104 meq/L (ref 96–112)
CO2: 28 mEq/L (ref 19–32)
CREATININE: 0.92 mg/dL (ref 0.40–1.20)
GFR: 71.83 mL/min (ref >60.00)
Glucose, Bld: 99 mg/dL (ref 70–99)
POTASSIUM: 4 meq/L (ref 3.5–5.1)
Sodium: 138 mEq/L (ref 135–145)
Total Bilirubin: 0.6 mg/dL (ref 0.2–1.2)
Total Protein: 7.2 g/dL (ref 6.0–8.3)

## 2018-06-02 LAB — LIPID PANEL
CHOL/HDL RATIO: 3
Cholesterol: 188 mg/dL (ref 0–200)
HDL: 66 mg/dL (ref >39.00)
LDL Cholesterol: 110 mg/dL — ABNORMAL HIGH (ref 0–99)
NONHDL: 122.2
Triglycerides: 59 mg/dL (ref 0.0–149.0)
VLDL: 11.8 mg/dL (ref 0.0–40.0)

## 2018-06-02 LAB — TSH: TSH: 3.55 u[IU]/mL (ref 0.35–4.50)

## 2018-06-02 LAB — VITAMIN D 25 HYDROXY (VIT D DEFICIENCY, FRACTURES): VITD: 59.36 ng/mL (ref 30.00–100.00)

## 2018-06-02 LAB — T4, FREE: Free T4: 0.84 ng/dL (ref 0.60–1.60)

## 2018-06-02 MED ORDER — FLECAINIDE ACETATE 100 MG PO TABS: ORAL_TABLET | ORAL | 3 refills | Status: DC

## 2018-06-02 NOTE — Assessment & Plan Note (Signed)
Reviewed preventive care protocols, scheduled due services, and updated immunizations Discussed nutrition, exercise, diet, and healthy lifestyle.  Orders Placed This Encounter  Procedures  . Vitamin D (25 hydroxy)  . Lipid panel  . TSH  . T4, free  . Comprehensive metabolic panel

## 2018-06-02 NOTE — Assessment & Plan Note (Addendum)
Followed by cardiology, Seeing Dr. Kaylyn Lim. On flecainide.

## 2018-06-02 NOTE — Patient Instructions (Signed)
Medication Instructions:   Your physician recommends that you continue on your current medications as directed. Please refer to the Current Medication list given to you today.   If you need a refill on your cardiac medications before your next appointment, please call your pharmacy.  Labwork: NONE ORDERED  TODAY    Testing/Procedures: NONE ORDERED  TODAY    Follow-Up: Your physician wants you to follow-up in: ONE YEAR WITH  KLEIN   You will receive a reminder letter in the mail two months in advance. If you don't receive a letter, please call our office to schedule the follow-up appointment.     Any Other Special Instructions Will Be Listed Below (If Applicable).                                                                                                                                                    

## 2018-06-02 NOTE — Assessment & Plan Note (Signed)
She is due for another another [place] but she wants to wait until next year because of insurance.

## 2018-06-02 NOTE — Patient Instructions (Signed)
Great to meet you.  I will call you with your lab results from today and you can view them online.   

## 2018-06-02 NOTE — Progress Notes (Signed)
Subjective:   Patient ID: Heather Rice, female    DOB: 08-Jan-1976, 42 y.o.   MRN: [id number]  Heather Rice is a pleasant 42 y.o. year old female who presents to clinic today with New Patient (Initial Visit) (Patient is here today to establish care.  She would like to have her CPE without PAP.  She is scheduled for 1pm for her Mammogram. Her last PAP was completed on 9.28.19 @ [facility].  Doctors Allred and Clide Cliff serve as her Cardiologists.  She is currently fasting.  She declines HIV lab.)  on 06/02/2018  HPI:  Health Maintenance  Topic Date Due  . INFLUENZA VACCINE  08/01/2018 (Originally 05/15/2018)  . PAP SMEAR  07/13/2019  . TETANUS/TDAP  03/04/2021  . HIV Screening  Discontinued   Mammogram scheduled for 1 pm today. Last pap smear was done by her GYN at [facility] in 06/2018. Has a mirena IUD.  She does have h/o ventricular tachycardia.  Followed by cardiology.  Was last seen by Francis Dowse, cardiology PA on 12/1616. Note reviewed.  Has appointment with Dr. Graciela Husbands. This has been controlled on flecainide. Failed ablation at the time.  Lab Results  Component Value Date   CHOL 188 12/28/2016   HDL 69 12/28/2016   LDLCALC 104 (H) 12/28/2016   TRIG 73 12/28/2016   CHOLHDL 2.7 12/28/2016   History of thyroid nodule- she did see Dr. Lurene Shadow,  last [place] in Epic done on 06/14/14- Stable small bilateral hypoechoic nodules measuring 2 mm or less. Findings do not meet current SRU consensus criteria for biopsy. Follow-up by clinical exam is recommended. If patient has known risk factors for thyroid carcinoma, consider follow-up ultrasound in 12 months. If patient is clinically hyperthyroid, consider nuclear medicine thyroid uptake and scan.Reference: Management of Thyroid Nodules Detected at [place]: Society of Radiologists in Ultrasound Consensus Conference Statement. Radiology 2005; [id number].      Current Outpatient Medications on File Prior to Visit  Medication  Sig Dispense Refill  . cholecalciferol (VITAMIN D) 1000 UNITS tablet Take 1,000 Units by mouth daily.    . flecainide (TAMBOCOR) 100 MG tablet Take 1 tablet by mouth two times daily. Please keep upcoming appt in August. Final Attempt 180 tablet 0  . levonorgestrel (MIRENA) 20 MCG/24HR IUD 1 each by Intrauterine route once. Implanted April 2018    . valACYclovir (VALTREX) 1000 MG tablet Take 1 tablet by mouth 2 (two) times daily as needed.     No current facility-administered medications on file prior to visit.     No Known Allergies  Past Medical History:  Diagnosis Date  . History of chicken pox   . Thyroid cyst 2004   After having her daughter but resolved  . VT (ventricular tachycardia) (HCC)     Past Surgical History:  Procedure Laterality Date  . ELECTROPHYSIOLOGIC STUDY N/A 09/27/2015   Procedure: PVC Ablation;  Surgeon: Hillis Range, MD;  Location: [facility] INVASIVE CV LAB;  Service: Cardiovascular;  Laterality: N/A;    Family History  Problem Relation Age of Onset  . Diabetes Mother   . Arthritis Mother   . Early death Father        MVA  . Diabetes Maternal Grandmother   . Birth defects Maternal Grandfather   . Diabetes Paternal Grandmother   . Cancer Paternal Grandfather        throat    Social History   Socioeconomic History  . Marital status: Married    Spouse name: Not on  file  . Number of children: Not on file  . Years of education: Not on file  . Highest education level: Not on file  Occupational History  . Not on file  Social Needs  . Financial resource strain: Not on file  . Food insecurity:    Worry: Not on file    Inability: Not on file  . Transportation needs:    Medical: Not on file    Non-medical: Not on file  Tobacco Use  . Smoking status: Never Smoker  . Smokeless tobacco: Never Used  Substance and Sexual Activity  . Alcohol use: No  . Drug use: No  . Sexual activity: Yes    Birth control/protection: IUD  Lifestyle  . Physical  activity:    Days per week: Not on file    Minutes per session: Not on file  . Stress: Not on file  Relationships  . Social connections:    Talks on phone: Not on file    Gets together: Not on file    Attends religious service: Not on file    Active member of club or organization: Not on file    Attends meetings of clubs or organizations: Not on file    Relationship status: Not on file  . Intimate partner violence:    Fear of current or ex partner: Not on file    Emotionally abused: Not on file    Physically abused: Not on file    Forced sexual activity: Not on file  Other Topics Concern  . Not on file  Social History Narrative  . Not on file   The PMH, PSH, Social History, Family History, Medications, and allergies have been reviewed in [facility], and have been updated if relevant.   Review of Systems  Constitutional: Negative.   HENT: Negative.   Eyes: Negative.   Respiratory: Negative.   Cardiovascular: Negative.   Gastrointestinal: Negative.   Endocrine: Negative.   Genitourinary: Negative.   Musculoskeletal: Negative.   Allergic/Immunologic: Negative.   Neurological: Negative.   Hematological: Negative.   Psychiatric/Behavioral: Negative.   All other systems reviewed and are negative.      Objective:    BP 116/70 (BP Location: Left Arm, Patient Position: Sitting, Cuff Size: Normal)   Pulse 67   Temp 99.3 F (37.4 C) (Oral)   Ht 5' 10.25" (1.784 m)   Wt 181 lb 3.2 oz (82.2 kg)   SpO2 98%   BMI 25.81 kg/m    Physical Exam    General:  Well-developed,well-nourished,in no acute distress; alert,appropriate and cooperative throughout examination Head:  normocephalic and atraumatic.   Eyes:  vision grossly intact, PERRL Ears:  R ear normal and L ear normal externally, TMs clear bilaterally Nose:  no external deformity.   Mouth:  good dentition.   Neck:  No deformities, masses, or tenderness noted. Lungs:  Normal respiratory effort, chest expands  symmetrically. Lungs are clear to auscultation, no crackles or wheezes. Heart:  Normal rate and regular rhythm. S1 and S2 normal without gallop, murmur, click, rub or other extra sounds. Abdomen:  Bowel sounds positive,abdomen soft and non-tender without masses, organomegaly or hernias noted. Msk:  No deformity or scoliosis noted of thoracic or lumbar spine.   Extremities:  No clubbing, cyanosis, edema, or deformity noted with normal full range of motion of all joints.   Neurologic:  alert & oriented X3 and gait normal.   Skin:  Intact without suspicious lesions or rashes Cervical Nodes:  No lymphadenopathy noted Axillary  Nodes:  No palpable lymphadenopathy Psych:  Cognition and judgment appear intact. Alert and cooperative with normal attention span and concentration. No apparent delusions, illusions, hallucinations      Assessment & Plan:   Well woman exam without gynecological exam  History of vitamin D deficiency - Plan: Vitamin D (25 hydroxy)  Panic attacks  VT (ventricular tachycardia)-repetitive monomorphic  Thyroid nodule  Thyroid disease - Plan: Lipid panel, TSH, T4, free, Comprehensive metabolic panel No follow-ups on file.

## 2018-06-02 NOTE — Assessment & Plan Note (Signed)
Check Vit D today. 

## 2018-08-04 DIAGNOSIS — Z01419 Encounter for gynecological examination (general) (routine) without abnormal findings: Secondary | ICD-10-CM | POA: Diagnosis not present

## 2018-08-04 DIAGNOSIS — Z6826 Body mass index (BMI) 26.0-26.9, adult: Secondary | ICD-10-CM | POA: Diagnosis not present

## 2018-08-04 DIAGNOSIS — Z1212 Encounter for screening for malignant neoplasm of rectum: Secondary | ICD-10-CM | POA: Diagnosis not present

## 2018-10-26 DIAGNOSIS — R21 Rash and other nonspecific skin eruption: Secondary | ICD-10-CM | POA: Diagnosis not present

## 2018-10-27 ENCOUNTER — Telehealth: Payer: Self-pay | Admitting: Allergy and Immunology

## 2018-10-27 NOTE — Telephone Encounter (Signed)
Pt called and would like to talk with a nurse about some skin test. [phone] last seen 2015.

## 2018-10-27 NOTE — Telephone Encounter (Signed)
Pt wanted clarity on results of allergy testing done in 2015. concerns addressed. Pt verbalized understanding.

## 2018-10-28 DIAGNOSIS — B86 Scabies: Secondary | ICD-10-CM | POA: Diagnosis not present

## 2019-05-28 ENCOUNTER — Other Ambulatory Visit: Payer: Self-pay | Admitting: Physician Assistant

## 2019-06-04 NOTE — Progress Notes (Addendum)
Subjective:   Patient ID: Heather Rice, female    DOB: 1975/10/31, 43 y.o.   MRN: [id number]  Heather Rice is a pleasant 43 y.o. year old female who presents to clinic today with Annual Exam (CPE/ no pap- fasting/ denies flu)  on 06/08/2019  HPI:  I last saw pt when she established care with me last year.  Health Maintenance  Topic Date Due  . INFLUENZA VACCINE  05/16/2019  . PAP SMEAR-Modifier  07/13/2019  . TETANUS/TDAP  03/04/2021  . HIV Screening  Discontinued   Has a dermatologist.  She does have a rash on back of upper thigh- hypopigmented.  Never itchy.  Last pap smear [facility]. Has a mirena IUD - has had two mirenas in past years. Has appointment with Dr. Helane Rima in October.  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  Followed by cardiology.  Was last seen by Tommye Standard, cardiology PA on8/19/19.  Note reviewed.  Has appointment with Dr. Caryl Comes. This has been controlled on flecainide. Failed ablation at the time.  Has appt with Dr Caryl Comes today at 1 pm.  Lab Results  Component Value Date   CHOL 188 06/02/2018   HDL 66.00 06/02/2018   LDLCALC 110 (H) 06/02/2018   TRIG 59.0 06/02/2018   CHOLHDL 3 06/02/2018   History of thyroid nodule- she did see Dr. Bubba Camp,  last [place] in [place] done on 06/14/14- Stable small bilateral hypoechoic nodules measuring 2 mm or less. Findings do not meet current SRU consensus criteria for biopsy. Follow-up by clinical exam is  recommended. If patient has known risk factors for thyroid carcinoma, consider follow-up ultrasound in 12 months. If patient is clinically hyperthyroid, consider nuclear medicine thyroid uptake and scan.Reference: Management of Thyroid Nodules Detected at [place]: Society of Radiologists in [place]. Radiology 2005; [id number]. Lab Results  Component Value Date   TSH 3.55 06/02/2018     Lab Results  Component Value Date   CHOL 188 06/02/2018   HDL 66.00 06/02/2018   LDLCALC 110 (H) 06/02/2018   TRIG 59.0 06/02/2018   CHOLHDL 3 06/02/2018   Lab Results  Component Value Date   WBC 7.0 08/28/2017   HGB 14.9 08/28/2017   HCT 43 08/28/2017   MCV 92.1 09/20/2015   PLT 172 08/28/2017     Current Outpatient Medications on File Prior to Visit  Medication Sig Dispense Refill  . cholecalciferol (VITAMIN D) 1000 UNITS tablet Take 1,000 Units by mouth daily.    . flecainide (TAMBOCOR) 100 MG tablet TAKE 1 TABLET TWICE A DAY (PLEASE KEEP UPCOMING APPOINTMENT IN [place]. FINAL ATTEMPT) 60 tablet 0  . levonorgestrel (MIRENA) 20 MCG/24HR IUD 1 each by Intrauterine route once. Implanted April 2018    . valACYclovir (VALTREX) 1000 MG tablet Take 1 tablet by mouth 2 (two) times daily as needed.     No current facility-administered medications on file prior to visit.  No Known Allergies  Past Medical History:  Diagnosis Date  . History of chicken pox   . Thyroid cyst 2004   After having her daughter but resolved  . VT (ventricular tachycardia) (HCC)     Past Surgical History:  Procedure Laterality Date  . ELECTROPHYSIOLOGIC STUDY N/A 09/27/2015   Procedure: PVC Ablation;  Surgeon: Hillis Range, MD;  Location: [facility] INVASIVE CV LAB;  Service: Cardiovascular;  Laterality: N/A;    Family History  Problem Relation Age of Onset  . Diabetes Mother   . Arthritis Mother   . Early death Father        MVA  . Diabetes Maternal Grandmother   . Birth defects  Maternal Grandfather   . Diabetes Paternal Grandmother   . Cancer Paternal Grandfather        throat    Social History   Socioeconomic History  . Marital status: Married    Spouse name: Not on file  . Number of children: Not on file  . Years of education: Not on file  . Highest education level: Not on file  Occupational History  . Not on file  Social Needs  . Financial resource strain: Not on file  . Food insecurity    Worry: Not on file    Inability: Not on file  . Transportation needs    Medical: Not on file    Non-medical: Not on file  Tobacco Use  . Smoking status: Never Smoker  . Smokeless tobacco: Never Used  Substance and Sexual Activity  . Alcohol use: No  . Drug use: No  . Sexual activity: Yes    Birth control/protection: I.U.D.  Lifestyle  . Physical activity    Days per week: Not on file    Minutes per session: Not on file  . Stress: Not on file  Relationships  . Social Musician on phone: Not on file    Gets together: Not on file    Attends religious service: Not on file    Active member of club or organization: Not on file    Attends meetings of clubs or organizations: Not on file    Relationship status: Not on file  . Intimate partner violence    Fear of current or ex partner: Not on file    Emotionally abused: Not on file    Physically abused: Not on file    Forced sexual activity: Not on file  Other Topics Concern  . Not on file  Social History Narrative  . Not on file   The PMH, PSH, Social History, Family History, Medications, and allergies have been reviewed in [facility], and have been updated if relevant.   Review of Systems  Constitutional: Negative.   HENT: Negative.   Eyes: Negative.   Respiratory: Negative.   Cardiovascular: Negative.   Gastrointestinal: Negative.   Endocrine: Negative.   Genitourinary: Negative.   Musculoskeletal: Negative.   Allergic/Immunologic: Negative.   Neurological: Negative.   Hematological:  Negative.   Psychiatric/Behavioral: Negative.   All other systems reviewed and are negative.      Objective:    BP 106/70   Pulse (!) 56   Temp 98 F (36.7 C) (Oral)   Ht 5\' 9"  (1.753 m)   Wt 189 lb 6.4 oz (85.9 kg)   SpO2 98%   BMI 27.97 kg/m    Physical Exam Vitals signs and nursing note reviewed.  Constitutional:      Appearance: Normal appearance. She is normal  weight.  HENT:     Head: Normocephalic and atraumatic.     Right Ear: Ear canal and external ear normal.     Left Ear: Ear canal and external ear normal.     Nose: Nose normal.     Mouth/Throat:     Mouth: Mucous membranes are moist.  Eyes:     Extraocular Movements: Extraocular movements intact.  Neck:     Musculoskeletal: Normal range of motion.  Cardiovascular:     Rate and Rhythm: Normal rate and regular rhythm.     Pulses: Normal pulses.     Heart sounds: Normal heart sounds.  Pulmonary:     Effort: Pulmonary effort is normal.     Breath sounds: Normal breath sounds.  Abdominal:     General: Abdomen is flat.     Palpations: Abdomen is soft.  Musculoskeletal: Normal range of motion.        General: No swelling.  Skin:    General: Skin is warm and dry.     Findings: Rash present.     Comments: Hypopigmented lesions on bilateral back of upper thigh consistent with tinea versicolor.  Neurological:     General: No focal deficit present.     Mental Status: She is alert and oriented to person, place, and time. Mental status is at baseline.  Psychiatric:        Mood and Affect: Mood normal.        Behavior: Behavior normal.        Thought Content: Thought content normal.        Judgment: Judgment normal.            Assessment & Plan:   Well woman exam without gynecological exam  History of vitamin D deficiency - Plan: Vitamin D (25 hydroxy)  Thyroid nodule - Plan: TSH, T4, free, Lipid panel, Comprehensive metabolic panel, CBC with Differential/Platelet  VT (ventricular  tachycardia)-repetitive monomorphic - Plan: CBC with Differential/Platelet  Arrhythmogenic right ventricular dysplasia (HCC), Chronic No follow-ups on file.

## 2019-06-05 ENCOUNTER — Telehealth: Payer: Self-pay

## 2019-06-05 NOTE — Telephone Encounter (Signed)
Questions for Screening COVID-19  Symptom onset: None  Travel or Contacts: None  During this illness, did/does the patient experience any of the following symptoms? Fever >100.18F []   Yes [x]   No []   Unknown Subjective fever (felt feverish) []   Yes [x]   No []   Unknown Chills []   Yes [x]   No []   Unknown Muscle aches (myalgia) []   Yes [x]   No []   Unknown Runny nose (rhinorrhea) []   Yes [x]   No []   Unknown Sore throat []   Yes [x]   No []   Unknown Cough (new onset or worsening of chronic cough) []   Yes [x]   No []   Unknown Shortness of breath (dyspnea) []   Yes [x]   No []   Unknown Nausea or vomiting []   Yes [x]   No []   Unknown Headache []   Yes [x]   No []   Unknown Abdominal pain  []   Yes [x]   No []   Unknown Diarrhea (?3 loose/looser than normal stools/24hr period) []   Yes [x]   No []   Unknown Other, specify:  Patient risk factors: Smoker? []   Current []   Former []   Never If female, currently pregnant? []   Yes []   No  Patient Active Problem List   Diagnosis Date Noted  . Well woman exam without gynecological exam 06/02/2018  . History of vitamin [place] deficiency 06/02/2018  . History of thyroid nodule 06/02/2018  . Cold sore 08/27/2017  . Gallbladder polyp 07/26/2015  . Panic attacks 07/26/2015  . Thyroid nodule 07/26/2015  . 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 VT (ventricular tachycardia)-repetitive monomorphic     Plan:  []   High risk for COVID-19 with red flags go to ED (with CP, SOB, weak/lightheaded, or fever > 101.5). Call ahead.  []   High risk for COVID-19 but stable. Inform provider and coordinate time for [facility] visit.   []   No red flags but URI signs or symptoms okay for [facility] visit.

## 2019-06-08 ENCOUNTER — Ambulatory Visit (INDEPENDENT_AMBULATORY_CARE_PROVIDER_SITE_OTHER): Payer: BC Managed Care – PPO | Admitting: Family Medicine

## 2019-06-08 ENCOUNTER — Ambulatory Visit (INDEPENDENT_AMBULATORY_CARE_PROVIDER_SITE_OTHER): Payer: BC Managed Care – PPO | Admitting: Internal Medicine

## 2019-06-08 ENCOUNTER — Other Ambulatory Visit: Payer: Self-pay

## 2019-06-08 ENCOUNTER — Encounter: Payer: Self-pay | Admitting: Internal Medicine

## 2019-06-08 VITALS — BP 120/82 | HR 68 | Ht 69.0 in | Wt 190.0 lb

## 2019-06-08 VITALS — BP 106/70 | HR 56 | Temp 98.0°F | Ht 69.0 in | Wt 189.4 lb

## 2019-06-08 DIAGNOSIS — I428 Other cardiomyopathies: Secondary | ICD-10-CM | POA: Insufficient documentation

## 2019-06-08 DIAGNOSIS — I472 Ventricular tachycardia, unspecified: Secondary | ICD-10-CM

## 2019-06-08 DIAGNOSIS — Z8639 Personal history of other endocrine, nutritional and metabolic disease: Secondary | ICD-10-CM

## 2019-06-08 DIAGNOSIS — R21 Rash and other nonspecific skin eruption: Secondary | ICD-10-CM | POA: Insufficient documentation

## 2019-06-08 DIAGNOSIS — Z0000 Encounter for general adult medical examination without abnormal findings: Secondary | ICD-10-CM

## 2019-06-08 DIAGNOSIS — E041 Nontoxic single thyroid nodule: Secondary | ICD-10-CM

## 2019-06-08 LAB — CBC WITH DIFFERENTIAL/PLATELET
Basophils Absolute: 0 10*3/uL (ref 0.0–0.1)
Basophils Relative: 0.6 % (ref 0.0–3.0)
Eosinophils Absolute: 0.2 10*3/uL (ref 0.0–0.7)
Eosinophils Relative: 2.9 % (ref 0.0–5.0)
HCT: 41.2 % (ref 36.0–46.0)
Hemoglobin: 14.3 g/dL (ref 12.0–15.0)
Lymphocytes Relative: 41.5 % (ref 12.0–46.0)
Lymphs Abs: 2.3 10*3/uL (ref 0.7–4.0)
MCHC: 34.7 g/dL (ref 30.0–36.0)
MCV: 91.5 fl (ref 78.0–100.0)
Monocytes Absolute: 0.4 10*3/uL (ref 0.1–1.0)
Monocytes Relative: 6.7 % (ref 3.0–12.0)
Neutro Abs: 2.7 10*3/uL (ref 1.4–7.7)
Neutrophils Relative %: 48.3 % (ref 43.0–77.0)
Platelets: 169 10*3/uL (ref 150.0–400.0)
RBC: 4.51 Mil/uL (ref 3.87–5.11)
RDW: 11.8 % (ref 11.5–15.5)
WBC: 5.6 10*3/uL (ref 4.0–10.5)

## 2019-06-08 LAB — LIPID PANEL
Cholesterol: 196 mg/dL (ref 0–200)
HDL: 56.5 mg/dL (ref >39.00)
LDL Cholesterol: 123 mg/dL — ABNORMAL HIGH (ref 0–99)
NonHDL: 139.06
Total CHOL/HDL Ratio: 3
Triglycerides: 78 mg/dL (ref 0.0–149.0)
VLDL: 15.6 mg/dL (ref 0.0–40.0)

## 2019-06-08 LAB — COMPREHENSIVE METABOLIC PANEL
ALT: 10 U/L (ref 0–35)
AST: 13 U/L (ref 0–37)
Albumin: 4.5 g/dL (ref 3.5–5.2)
Alkaline Phosphatase: 59 U/L (ref 39–117)
BUN: 11 mg/dL (ref 6–23)
CO2: 27 mEq/L (ref 19–32)
Calcium: 9.2 mg/dL (ref 8.4–10.5)
Chloride: 105 mEq/L (ref 96–112)
Creatinine, Ser: 0.7 mg/dL (ref 0.40–1.20)
GFR: 92.17 mL/min (ref >60.00)
Glucose, Bld: 97 mg/dL (ref 70–99)
Potassium: 3.9 mEq/L (ref 3.5–5.1)
Sodium: 139 mEq/L (ref 135–145)
Total Bilirubin: 0.5 mg/dL (ref 0.2–1.2)
Total Protein: 6.7 g/dL (ref 6.0–8.3)

## 2019-06-08 MED ORDER — FLECAINIDE ACETATE 100 MG PO TABS: 100.0000 mg | ORAL_TABLET | Freq: Two times a day (BID) | ORAL | 3 refills | Status: DC

## 2019-06-08 NOTE — Assessment & Plan Note (Signed)
Check Vit D levels today.

## 2019-06-08 NOTE — Assessment & Plan Note (Signed)
New- Hypopigmented lesions on bilateral back of upper thigh consistent with tinea versicolor. Advised supportive care, including dandruff shampoo. See AVS for detail.  Orders Placed This Encounter  Procedures  . TSH  . T4, free  . Lipid panel  . Comprehensive metabolic panel  . CBC with Differential/Platelet  . Vitamin D (25 hydroxy)

## 2019-06-08 NOTE — Progress Notes (Signed)
      Patient Care Team: Lucille Passy, MD as PCP - General (Family Medicine)   HPI  Heather Rice is a 43 y.o. 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 These were short nonsustained runs Effort to treat with calcium blockers and beta blockers were insufficient and so she ended up on  flecainide and metoprolol. No l onger on beta blockers   Exercise test July showed no evidence of ventricular tachycardia  ECGs have shown T-wave inversion from V4 variably back thorugh the data we reviewed to 2007  DATE TEST EF   9/16 cMRI  67 % Normal              SAECG 9/16 No ( 112/43/12)  DATE PR interval QRSduration Dose  8/20 174 86 100     The patient denies SOB, chest pain edema or palpitations.  There has been no syncope or presyncope.     Past Medical History:  Diagnosis Date  . History of chicken pox   . Thyroid cyst 2004   After having her daughter but resolved  . VT (ventricular tachycardia) ([place])     Past Surgical History:  Procedure Laterality Date  . ELECTROPHYSIOLOGIC STUDY N/A 09/27/2015   Procedure: PVC Ablation;  Surgeon: Thompson Grayer, MD;  Location: [facility] CV LAB;  Service: Cardiovascular;  Laterality: N/A;    Current Outpatient Medications  Medication Sig Dispense Refill  . cholecalciferol (VITAMIN D) 1000 UNITS tablet Take 1,000 Units by mouth daily.    . flecainide (TAMBOCOR) 100 MG tablet Take 1 tablet (100 mg total) by mouth 2 (two) times daily. 180 tablet 3  . levonorgestrel (MIRENA) 20 MCG/24HR IUD 1 each by Intrauterine route once. Implanted April 2018    . valACYclovir (VALTREX) 1000 MG tablet Take 1 tablet by mouth 2 (two) times daily as needed.     No current facility-administered medications for this visit.     No Known Allergies  Review of Systems negative except  from HPI and PMH  Physical Exam BP 120/82   Pulse 68   Ht 5\' 9"  (1.753 m)   Wt 190 lb (86.2 kg)   SpO2 98%   BMI 28.06 kg/m  Well developed and well nourished in no acute distress HENT normal E scleral and icterus clear Neck Supple JVP flat; carotids brisk and full Clear to ausculation Regular rate and rhythm, no murmurs gallops or rub Soft with active bowel sounds No clubbing cyanosis  Edema Alert and oriented, grossly normal motor and sensory function Skin Warm and Dry   ECG sinus @ 68 17/09/43  Assessment and Plan  Ventricular tachycardia - repetitive monomorphic RVOT  Abnormal ECG     ECG increasingly normal  Rhythm controlled on flecainide  Has gained weight and will be working on a diet    We spent more than 50% of our >45 min visit in face to face counseling regarding the above

## 2019-06-08 NOTE — Assessment & Plan Note (Signed)
Reviewed preventive care protocols, scheduled due services, and updated immunizations Discussed nutrition, exercise, diet, and healthy lifestyle.  Declines influenza vaccine. 

## 2019-06-08 NOTE — Patient Instructions (Addendum)
Great to see you. I will call you with your lab results from today and you can view them online.    Happy birthday!!!   Tinea Versicolor  Tinea versicolor is a skin infection. It is caused by a type of yeast. It is normal for some yeast to be on your skin, but too much yeast causes this infection. The infection causes a rash of light or dark patches on your skin. The rash is most common on the chest, back, neck, legs or upper arms. The infection usually does not cause other problems.   Follow these instructions at home:  Use over-the-counter and prescription medicines only as told by your doctor.  Scrub your skin every day with dandruff shampoo as told by your doctor.  Do not scratch your skin in the rash area.  Avoid places that are hot and humid.  Do not use tanning booths.  Try to avoid sweating a lot. Contact a doctor if:  Your symptoms get worse.  You have a fever.  You have redness, swelling, or pain in the rash area.  You have fluid or blood coming from your rash.  Your rash feels warm to the touch.  You have pus or a bad smell coming from your rash.  Your rash comes back (recurs) after treatment. Summary  Tinea versicolor is a skin infection. It causes a rash of light or dark patches on your skin.  The rash is most common on the chest, back, neck, or upper arms. This infection usually does not cause other problems.  Use over-the-counter and prescription medicines only as told by your doctor.  If the infection is treated, it will probably go away in a few weeks. This information is not intended to replace advice given to you by your health care provider. Make sure you discuss any questions you have with your health care provider. Document Released: 09/13/2008 Document Revised: 09/13/2017 Document Reviewed: 06/03/2017 Elsevier Patient Education  2020 [employer].

## 2019-06-08 NOTE — Assessment & Plan Note (Signed)
Has been controlled on flecainide.  Failed ablation.  Has appt with Dr. Caryl Comes today.

## 2019-06-08 NOTE — Patient Instructions (Signed)
Medication Instructions:  Your physician recommends that you continue on your current medications as directed. Please refer to the Current Medication list given to you today.  Labwork: None ordered.  Testing/Procedures: None ordered.  Follow-Up: Your physician recommends that you schedule a follow-up appointment in:   12 mo with Dr. Klein   Any Other Special Instructions Will Be Listed Below (If Applicable).     If you need a refill on your cardiac medications before your next appointment, please call your pharmacy.  

## 2019-06-09 LAB — TSH: TSH: 2.88 u[IU]/mL (ref 0.35–4.50)

## 2019-06-09 LAB — T4, FREE: Free T4: 0.81 ng/dL (ref 0.60–1.60)

## 2019-06-09 LAB — VITAMIN D 25 HYDROXY (VIT D DEFICIENCY, FRACTURES): VITD: 38.73 ng/mL (ref 30.00–100.00)

## 2019-08-05 DIAGNOSIS — M9902 Segmental and somatic dysfunction of thoracic region: Secondary | ICD-10-CM | POA: Diagnosis not present

## 2019-08-05 DIAGNOSIS — M9901 Segmental and somatic dysfunction of cervical region: Secondary | ICD-10-CM | POA: Diagnosis not present

## 2019-08-05 DIAGNOSIS — M62838 Other muscle spasm: Secondary | ICD-10-CM | POA: Diagnosis not present

## 2019-08-05 DIAGNOSIS — M542 Cervicalgia: Secondary | ICD-10-CM | POA: Diagnosis not present

## 2019-08-07 DIAGNOSIS — M9902 Segmental and somatic dysfunction of thoracic region: Secondary | ICD-10-CM | POA: Diagnosis not present

## 2019-08-07 DIAGNOSIS — M9901 Segmental and somatic dysfunction of cervical region: Secondary | ICD-10-CM | POA: Diagnosis not present

## 2019-08-07 DIAGNOSIS — M62838 Other muscle spasm: Secondary | ICD-10-CM | POA: Diagnosis not present

## 2019-08-07 DIAGNOSIS — M542 Cervicalgia: Secondary | ICD-10-CM | POA: Diagnosis not present

## 2019-08-14 DIAGNOSIS — Z1231 Encounter for screening mammogram for malignant neoplasm of breast: Secondary | ICD-10-CM | POA: Diagnosis not present

## 2019-08-14 DIAGNOSIS — Z6827 Body mass index (BMI) 27.0-27.9, adult: Secondary | ICD-10-CM | POA: Diagnosis not present

## 2019-08-14 DIAGNOSIS — Z01419 Encounter for gynecological examination (general) (routine) without abnormal findings: Secondary | ICD-10-CM | POA: Diagnosis not present

## 2019-08-27 ENCOUNTER — Telehealth: Payer: Self-pay

## 2019-08-27 ENCOUNTER — Telehealth: Payer: Self-pay | Admitting: Family Medicine

## 2019-08-27 NOTE — Telephone Encounter (Signed)
Copied from [place] [phone]. Topic: General - Other >> Aug 27, 2019  1:58 PM Keene Breath wrote: Reason for CRM: Patient called to inform the office that she will be dropping off a form that the doctor needs to fill out in the physician section.  Please advise and call if there are any questions at [phone]

## 2019-08-27 NOTE — Telephone Encounter (Signed)
Patient came into office and dropped office wellness forms that needs to be completed. Forms are in Dr. Hulen Shouts file in the front office. Please call patient at [phone] when forms are completed and ready to be picked up.

## 2019-08-31 NOTE — Telephone Encounter (Signed)
Pt aware that form is completed and ready to P/U at front/thx dmf

## 2019-11-30 DIAGNOSIS — M9901 Segmental and somatic dysfunction of cervical region: Secondary | ICD-10-CM | POA: Diagnosis not present

## 2019-11-30 DIAGNOSIS — M9902 Segmental and somatic dysfunction of thoracic region: Secondary | ICD-10-CM | POA: Diagnosis not present

## 2019-11-30 DIAGNOSIS — M542 Cervicalgia: Secondary | ICD-10-CM | POA: Diagnosis not present

## 2019-11-30 DIAGNOSIS — M62838 Other muscle spasm: Secondary | ICD-10-CM | POA: Diagnosis not present

## 2019-12-02 DIAGNOSIS — M9902 Segmental and somatic dysfunction of thoracic region: Secondary | ICD-10-CM | POA: Diagnosis not present

## 2019-12-02 DIAGNOSIS — M62838 Other muscle spasm: Secondary | ICD-10-CM | POA: Diagnosis not present

## 2019-12-02 DIAGNOSIS — M542 Cervicalgia: Secondary | ICD-10-CM | POA: Diagnosis not present

## 2019-12-02 DIAGNOSIS — M9901 Segmental and somatic dysfunction of cervical region: Secondary | ICD-10-CM | POA: Diagnosis not present

## 2019-12-07 DIAGNOSIS — M62838 Other muscle spasm: Secondary | ICD-10-CM | POA: Diagnosis not present

## 2019-12-07 DIAGNOSIS — M9901 Segmental and somatic dysfunction of cervical region: Secondary | ICD-10-CM | POA: Diagnosis not present

## 2019-12-07 DIAGNOSIS — M542 Cervicalgia: Secondary | ICD-10-CM | POA: Diagnosis not present

## 2019-12-07 DIAGNOSIS — M9902 Segmental and somatic dysfunction of thoracic region: Secondary | ICD-10-CM | POA: Diagnosis not present

## 2019-12-09 DIAGNOSIS — M62838 Other muscle spasm: Secondary | ICD-10-CM | POA: Diagnosis not present

## 2019-12-09 DIAGNOSIS — M9901 Segmental and somatic dysfunction of cervical region: Secondary | ICD-10-CM | POA: Diagnosis not present

## 2019-12-09 DIAGNOSIS — M9902 Segmental and somatic dysfunction of thoracic region: Secondary | ICD-10-CM | POA: Diagnosis not present

## 2019-12-09 DIAGNOSIS — M542 Cervicalgia: Secondary | ICD-10-CM | POA: Diagnosis not present

## 2019-12-10 DIAGNOSIS — M9901 Segmental and somatic dysfunction of cervical region: Secondary | ICD-10-CM | POA: Diagnosis not present

## 2019-12-10 DIAGNOSIS — M62838 Other muscle spasm: Secondary | ICD-10-CM | POA: Diagnosis not present

## 2019-12-10 DIAGNOSIS — M9902 Segmental and somatic dysfunction of thoracic region: Secondary | ICD-10-CM | POA: Diagnosis not present

## 2019-12-10 DIAGNOSIS — M542 Cervicalgia: Secondary | ICD-10-CM | POA: Diagnosis not present

## 2019-12-14 DIAGNOSIS — M542 Cervicalgia: Secondary | ICD-10-CM | POA: Diagnosis not present

## 2019-12-14 DIAGNOSIS — M9902 Segmental and somatic dysfunction of thoracic region: Secondary | ICD-10-CM | POA: Diagnosis not present

## 2019-12-14 DIAGNOSIS — M9901 Segmental and somatic dysfunction of cervical region: Secondary | ICD-10-CM | POA: Diagnosis not present

## 2019-12-14 DIAGNOSIS — M62838 Other muscle spasm: Secondary | ICD-10-CM | POA: Diagnosis not present

## 2019-12-16 DIAGNOSIS — M542 Cervicalgia: Secondary | ICD-10-CM | POA: Diagnosis not present

## 2019-12-16 DIAGNOSIS — M9902 Segmental and somatic dysfunction of thoracic region: Secondary | ICD-10-CM | POA: Diagnosis not present

## 2019-12-16 DIAGNOSIS — M62838 Other muscle spasm: Secondary | ICD-10-CM | POA: Diagnosis not present

## 2019-12-16 DIAGNOSIS — M9901 Segmental and somatic dysfunction of cervical region: Secondary | ICD-10-CM | POA: Diagnosis not present

## 2019-12-17 DIAGNOSIS — M9901 Segmental and somatic dysfunction of cervical region: Secondary | ICD-10-CM | POA: Diagnosis not present

## 2019-12-17 DIAGNOSIS — M542 Cervicalgia: Secondary | ICD-10-CM | POA: Diagnosis not present

## 2019-12-17 DIAGNOSIS — M62838 Other muscle spasm: Secondary | ICD-10-CM | POA: Diagnosis not present

## 2019-12-17 DIAGNOSIS — M9902 Segmental and somatic dysfunction of thoracic region: Secondary | ICD-10-CM | POA: Diagnosis not present

## 2019-12-17 DIAGNOSIS — M62830 Muscle spasm of back: Secondary | ICD-10-CM | POA: Diagnosis not present

## 2020-02-09 ENCOUNTER — Telehealth: Payer: Self-pay | Admitting: Physician Assistant

## 2020-02-09 ENCOUNTER — Other Ambulatory Visit: Payer: Self-pay | Admitting: *Deleted

## 2020-02-09 NOTE — Telephone Encounter (Signed)
New Message:    Pt wants  Renee to know that she is ready to schedule Ultrasound on her legs please.

## 2020-02-11 NOTE — Telephone Encounter (Signed)
Spoke with the pt and she reports some bilateral leg "aching" for a few weeks.. 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 she says it worsens when she sits outside in the cold.   She denies color change, edema, heat.. no claudication.   Pt says she needs a Monday appt... appt with Francis Dowse PA 02/22/20 but will call back if anything changes or worsens.   Per Renee in response to pt previous message:   It has been since 2019 that I sw her and discussed getting LE US done. She saw Dr. Graciela Husbands 8 mo ago. He does not mention any ongoing LE issues, worries. I am happy to see her or perhaps on Andy's schedule this week if she wants to reassess need for any tests/imaging

## 2020-02-11 NOTE — Telephone Encounter (Signed)
Follow Up  Patient states that she is already due for her annual visit in August 2021. So patient states that instead of coming to the office first, she would rather have the order put in and imaging done first then follow up at her annual visit. Please give patient a call back to discuss.

## 2020-02-21 NOTE — Progress Notes (Signed)
Cardiology Office Note Date:  02/21/2020  Patient ID:  Heather Rice, Heather Rice 06-18-1976, MRN [id number] PCP:  Lucille Passy, MD  Electrophysiologist: Dr. Caryl Comes   Chief Complaint:  LE aching  History of Present Illness: Heather Rice is a 45 y.o. female with history of VT, NSVT, PVC's, treated with BB and Flecainide, she was referred to Dr. Rayann Heman and underwent EPS without inducible VT, subsequently at her last office visit, a f/u with Dr. Rayann Heman, (Jan 2017) in discussion of treatment options (weaning Flecainide and consider ablation if recurrent VT or continuing meds and watchful waiting), watchful waiting and ongoing medical therapy with Flecainide was decided.  I saw her in March 2018 for Dr. Caryl Comes for a routine, planned annual visit, she is feeling well, no c/o palpitations, no dizziness, near syncope or syncope, no CP or SOB.  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  Mentioned she did not exercise being worried about provoking a tachycardia.  She mentioned an uncle with PVD and wanted her LE and cholesterol checked.  Her EKG noted stable intervals, she had no exam findings to suggest any PVD, recommended to avoid long periods of sitting and initiate walking for exercise.  Lipds looked OK.  I saw her Aug 2019 She ws doing well.  She mentioned again feeling like her legs are heavy after prolonged periods of sitting. Reported  that she and her husband drove to and back from [place], and when 1st getting out of the care her legs feel tired/heavy.  No pain, no swelling, no skin changes.  She had not had any palpitations, no CP, no SOB.  2 weeksprior while standing at work she had a split second of sudden dizziness.  None since.  She mentioned on a couple of occassions upon standing she felt transiently lightheaded, this different that momentary spell.  Also stated that she had found  infrequently her BP 23'J systolic and generally feeling tired.  She was being evaluated for thyroid cysts, had labs done that AM.   No syncope.  She does not exercise.  She does not describe symptoms of claudication. She was recommended to increase hydration and undergo LE venou [place] and ABI for completeness.  She wanted to hold off.   Most recently saw Dr. Caryl Comes Aug 2020, she was doing well, no changes were made.  She feels well over all.  She walks though nothing more strenuous given when she VT previously she was exercising.  She denies any CP, palpitations, SOB or DOE.  No near syncope or syncope. She is worried about the circulation in her legs.  Mentions an uncle hs severe PAD and had to have an amputations. She was at her daughter's Rudene Anda party a few weeks back, and as night came and became cool >cold out the aching in her legs became much worse.  She at some times feels like walking helps the aching sensation, but also when walks stairs for example feels like her calves hurt, painful. She has also noted R foot lvery lateral edge will feel like pins/needles when walking (this is rare)      Past Medical History:  Diagnosis Date  . History of chicken pox   . Thyroid cyst 2004   After having her daughter but resolved  . VT (ventricular tachycardia) ([place])     Past Surgical History:  Procedure Laterality Date  .  ELECTROPHYSIOLOGIC STUDY N/A 09/27/2015   Procedure: PVC Ablation;  Surgeon: Hillis Range, MD;  Location: [facility] INVASIVE CV LAB;  Service: Cardiovascular;  Laterality: N/A;    Current Outpatient Medications  Medication Sig Dispense Refill  . cholecalciferol (VITAMIN D) 1000 UNITS tablet Take 1,000 Units by mouth daily.    . flecainide (TAMBOCOR) 100 MG tablet Take 1 tablet (100 mg total) by mouth 2 (two) times daily. 180 tablet 3  . levonorgestrel (MIRENA) 20 MCG/24HR IUD 1 each by Intrauterine route once. Implanted April 2018    . valACYclovir (VALTREX) 1000 MG tablet Take  1 tablet by mouth 2 (two) times daily as needed.     No current facility-administered medications for this visit.    Allergies:   Patient has no known allergies.   Social History:  The patient  reports that she has never smoked. She has never used smokeless tobacco. She reports that she does not drink alcohol or use drugs.   Family History:  The patient's family history includes Arthritis in her mother; Birth defects in her maternal grandfather; Cancer in her paternal grandfather; Diabetes in her maternal grandmother, mother, and paternal grandmother; Early death in her father.  ROS:  Please see the history of present illness.  All other systems are reviewed and otherwise negative.   PHYSICAL EXAM:  VS:  There were no vitals taken for this visit. BMI: There is no height or weight on file to calculate BMI. Well nourished, well developed, in no acute distress  HEENT: normocephalic, atraumatic  Neck: no JVD, carotid bruits or masses Cardiac:   RRR; no significant murmurs, no rubs, or gallops Lungs:  CTA b/l, no wheezing, rhonchi or rales  Abd: soft, nontender MS: no deformity or atrophy Ext:  no edema, againwith excellent b/l pedal pulses and brisk cap refill, no skin changes or calf tenderness.  She has small very superficial spider veins L leg just inferior to knee she dates back to highschool. Skin: warm and dry, no rash Neuro:  No gross deficits appreciated Psych: euthymic mood, full affect    EKG:  Done today and reviewed by myself:  02/22/20: SR 63bpm, PR , QRS 34ms, QTc , nonspecific ST/T changes, appears unchanged 06/02/18: SR 66bpm, PR , QRS 8ms, QTc , nonspecific T changes, unchanged from prior 12/28/16: SB, 58bpm, PR , QRS 68ms, QTc , nonspecific T changes look similar to previous  03/30/14: TTE LVEF 60-65% No significant valve disease  02/17/13: Stress myoview Normal myocardial perfusion study   Recent Labs: 06/08/2019: ALT 10; BUN 11;  Creatinine, Ser 0.70; Hemoglobin 14.3; Platelets 169.0; Potassium 3.9; Sodium 139; TSH 2.88  06/08/2019: Cholesterol 196; HDL 56.50; LDL Cholesterol 123; Total CHOL/HDL Ratio 3; Triglycerides 78.0; VLDL 15.6   CrCl cannot be calculated (Patient's most recent lab result is older than the maximum 21 days allowed.).   Wt Readings from Last 3 Encounters:  06/08/19 190 lb (86.2 kg)  06/08/19 189 lb 6.4 oz (85.9 kg)  06/02/18 181 lb (82.1 kg)     Other studies reviewed: Additional studies/records reviewed today include: summarized above  ASSESSMENT AND PLAN:  1. VT     Doing well on Flecainide, stable intervals   2. LE symptoms     She is worried about a problem with her veins/circulation.     Exam does not suggest PVD or DVT/venous insufficiency      Will get arterial study/ABI of b/l LE as well as venous US.  She mentions she has back trouble  Disposition: pending above, though otherwise, 1 year, sooner if needed  Current medicines are reviewed at length with the patient today.  The patient did not have any concerns regarding medicines.  Judith Blonder, PA-C 02/21/2020 8:07 AM     [facility] [address] [place] [address] [phone] (office)  [phone] (fax)

## 2020-02-22 ENCOUNTER — Ambulatory Visit (INDEPENDENT_AMBULATORY_CARE_PROVIDER_SITE_OTHER): Payer: BC Managed Care – PPO | Admitting: Physician Assistant

## 2020-02-22 ENCOUNTER — Other Ambulatory Visit: Payer: Self-pay

## 2020-02-22 VITALS — BP 122/68 | HR 62 | Ht 69.0 in | Wt 165.0 lb

## 2020-02-22 DIAGNOSIS — M79604 Pain in right leg: Secondary | ICD-10-CM | POA: Diagnosis not present

## 2020-02-22 DIAGNOSIS — Z5181 Encounter for therapeutic drug level monitoring: Secondary | ICD-10-CM

## 2020-02-22 DIAGNOSIS — Z79899 Other long term (current) drug therapy: Secondary | ICD-10-CM

## 2020-02-22 DIAGNOSIS — I472 Ventricular tachycardia, unspecified: Secondary | ICD-10-CM

## 2020-02-22 DIAGNOSIS — M79605 Pain in left leg: Secondary | ICD-10-CM

## 2020-02-22 NOTE — Patient Instructions (Addendum)
Medication Instructions:   Your physician recommends that you continue on your current medications as directed. Please refer to the Current Medication list given to you today. *If you need a refill on your cardiac medications before your next appointment, please call your pharmacy*   Lab Work: NONE ORDERED  TODAY  If you have labs (blood work) drawn today and your tests are completely normal, you will receive your results only by: Marland Kitchen MyChart Message (if you have MyChart) OR . A paper copy in the mail If you have any lab test that is abnormal or we need to change your treatment, we will call you to review the results.   Testing/Procedures: Your physician has requested that you have an ankle brachial index (ABI). During this test an ultrasound and blood pressure cuff are used to evaluate the arteries that supply the arms and legs with blood. Allow thirty minutes for this exam. There are no restrictions or special instructions.  Your physician has requested that you have a lower extremity arterial exercise duplex. During this test, exercise and ultrasound are used to evaluate arterial blood flow in the legs. Allow one hour for this exam. There are no restrictions or special instructions.   Follow-Up: At [facility], you and your health needs are our priority.  As part of our continuing mission to provide you with exceptional heart care, we have created designated Provider Care Teams.  These Care Teams include your primary Cardiologist (physician) and Advanced Practice Providers (APPs -  Physician Assistants and Nurse Practitioners) who all work together to provide you with the care you need, when you need it.  We recommend signing up for the patient portal called "MyChart".  Sign up information is provided on this After Visit Summary.  MyChart is used to connect with patients for Virtual Visits (Telemedicine).  Patients are able to view lab/test results, encounter notes, upcoming appointments,  etc.  Non-urgent messages can be sent to your provider as well.   To learn more about what you can do with MyChart, go to [email or web address].    Your next appointment:   1 year(s)  The format for your next appointment:   In Person  Provider:   You may see  DR. kLEIN  or one of the following Advanced Practice Providers on your designated Care Team:    Gypsy Balsam, NP  Francis Dowse, PA-C  Casimiro Needle "Mardelle Matte" Lanna Poche, [place]    Other Instructions

## 2020-02-22 NOTE — Addendum Note (Signed)
Addended by: Oleta Mouse on: 02/22/2020 12:38 PM   Modules accepted: Orders

## 2020-02-22 NOTE — Addendum Note (Signed)
Addended by: Oleta Mouse on: 02/22/2020 03:32 PM   Modules accepted: Orders

## 2020-02-24 ENCOUNTER — Other Ambulatory Visit: Payer: Self-pay

## 2020-02-24 ENCOUNTER — Ambulatory Visit (HOSPITAL_COMMUNITY)
Admission: RE | Admit: 2020-02-24 | Discharge: 2020-02-24 | Disposition: A | Discharge status: Home / Self Care | Payer: BC Managed Care – PPO | Source: Ambulatory Visit | Attending: Cardiovascular Disease | Admitting: Cardiovascular Disease

## 2020-02-24 DIAGNOSIS — M79604 Pain in right leg: Secondary | ICD-10-CM | POA: Insufficient documentation

## 2020-02-24 DIAGNOSIS — M79605 Pain in left leg: Secondary | ICD-10-CM | POA: Insufficient documentation

## 2020-03-02 ENCOUNTER — Ambulatory Visit (HOSPITAL_COMMUNITY)
Admission: RE | Admit: 2020-03-02 | Discharge: 2020-03-02 | Disposition: A | Discharge status: Home / Self Care | Payer: BC Managed Care – PPO | Source: Ambulatory Visit | Attending: Cardiology | Admitting: Cardiology

## 2020-03-02 ENCOUNTER — Other Ambulatory Visit: Payer: Self-pay | Admitting: Physician Assistant

## 2020-03-02 ENCOUNTER — Other Ambulatory Visit: Payer: Self-pay

## 2020-03-02 DIAGNOSIS — I739 Peripheral vascular disease, unspecified: Secondary | ICD-10-CM

## 2020-03-02 DIAGNOSIS — M79605 Pain in left leg: Secondary | ICD-10-CM

## 2020-03-02 DIAGNOSIS — M79604 Pain in right leg: Secondary | ICD-10-CM | POA: Diagnosis not present

## 2020-05-02 ENCOUNTER — Telehealth: Payer: Self-pay | Admitting: General Practice

## 2020-05-02 NOTE — Telephone Encounter (Signed)
Patient is a former patient of Dr. Deborra Medina. She is scheduled to see Dr. Ethelene Hal on 08/27. She wants a referral to Endo. She wants to know if she can get the referral sent now since she saw Dr. Deborra Medina here at our location instead of having to wait until she is seen by Dr. Ethelene Hal. She said the info is in her chart pertaining to referral and the reason she didn't schedule back then is because she hadn't met her deductible to go ahead and schedule the referral. She said she doesn't want to come in just to get a referral. Please call her at [phone] and advise.

## 2020-05-03 NOTE — Telephone Encounter (Signed)
Patient is calling back to check the status of previous message left, please advise. CB is [phone]

## 2020-05-03 NOTE — Telephone Encounter (Signed)
Reviewed thyroid issues. We will need to recheck them when I see her and I will discuss the need for referral at that point. I reviewed labs and scan.

## 2020-05-03 NOTE — Telephone Encounter (Signed)
Patient calling states that she has TOC visit with Dr. Doreene Burke on 06/10/20 patient is a former patient of Dr. Dayton Martes last visit 06/04/19 she's calling for a referral to see endocrinology for thyroid issues per patient she would like thi referral before she come in to see Dr. Doreene Burke. Please advise.

## 2020-05-04 NOTE — Telephone Encounter (Signed)
Patient states that she would like to come in sooner since she have reached her deductible. Per patient she will check with her insurance and see if she will be charged OFP to come in sooner and give [place] a call back.

## 2020-06-03 DIAGNOSIS — R519 Headache, unspecified: Secondary | ICD-10-CM | POA: Diagnosis not present

## 2020-06-03 DIAGNOSIS — S80261A Insect bite (nonvenomous), right knee, initial encounter: Secondary | ICD-10-CM | POA: Diagnosis not present

## 2020-06-10 ENCOUNTER — Encounter: Payer: BC Managed Care – PPO | Admitting: Family Medicine

## 2020-06-21 ENCOUNTER — Telehealth: Payer: Self-pay | Admitting: Internal Medicine

## 2020-06-21 NOTE — Telephone Encounter (Signed)
Patient c/o Palpitations:  High priority if patient c/o lightheadedness, shortness of breath, or chest pain  1) How long have you had palpitations/irregular HR/ Afib? Are you having the symptoms now? No, for 2- 3 weeks  2) Are you currently experiencing lightheadedness, SOB or CP? no  3) Do you have a history of afib (atrial fibrillation) or irregular heart rhythm? yes  4) Have you checked your BP or HR? (document readings if available): BP and HR have been fine  5) Are you experiencing any other symptoms? No    Patient states she has been having some arhythmia for the last 2 - 3 weeks. She would like to know if she should have a holter monitor to check her heart.

## 2020-06-21 NOTE — Telephone Encounter (Signed)
Spoke with pt who is complaining of palpitations or possible irregular rhythm x 3 weeks.  Pt denies any additional symptoms at this time.  Appt scheduled with Francis Dowse, PA-C 06/22/2020 at 8am for further evaluation.  Reviewed ED precautions with pt.  Pt verbalizes understanding and agrees with current plan.

## 2020-06-21 NOTE — Progress Notes (Addendum)
Cardiology Office Note Date:  06/21/2020  Patient ID:  Elianah, Karis 1976/09/26, MRN [id number] PCP:  No primary care provider on file.  Electrophysiologist: Dr. Graciela Husbands   Chief Complaint:  palpitations  History of Present Illness: Zeenat Jeanbaptiste is a 44 y.o. female with history of VT, NSVT, PVC's, treated with BB and Flecainide, she was referred to Dr. Johney Frame and underwent EPS without inducible VT, subsequently at her last office visit, a f/u with Dr. Johney Frame, (Jan 2017) in discussion of treatment options (weaning Flecainide and consider ablation if recurrent VT or continuing meds and watchful waiting), watchful waiting and ongoing medical therapy with Flecainide was decided.  I saw her in March 2018 for Dr. Graciela Husbands for a routine, planned annual visit, she is feeling well, no c/o palpitations, no dizziness, near syncope or syncope, no CP or SOB.  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  Mentioned she did not exercise being worried about provoking a tachycardia.  She mentioned an uncle with PVD and wanted her LE and cholesterol checked.  Her EKG noted stable intervals, she had no exam findings to suggest any PVD, recommended to avoid long periods of sitting and initiate walking for exercise.  Lipds looked OK.  I saw her Aug 2019 She ws doing well.  She mentioned again feeling like her legs are heavy after prolonged periods of sitting. 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  No pain, no swelling, no skin changes.  She had not had any palpitations, no CP, no SOB.  2 weeksprior while standing at work she had a split second of sudden dizziness.  None since.  She mentioned on a couple of occassions upon standing she felt transiently lightheaded, this different that momentary spell.  Also stated that she  had found infrequently her BP 90's systolic and generally feeling tired.  She was being evaluated for thyroid cysts, had labs done that AM.   No syncope.  She does not exercise.  She does not describe symptoms of claudication. She was recommended to increase hydration and undergo LE venou [place] and ABI for completeness.  She wanted to hold off.   Most recently saw Dr. Graciela Husbands Aug 2020, she was doing well, no changes were made.   I saw her 02/22/2020 She feels well over all.  She walks though nothing more strenuous given when she VT previously she was exercising.  She denies any CP, palpitations, SOB or DOE.  No near syncope or syncope. She is worried about the circulation in her legs.  Mentions an uncle hs severe PAD and had to have an amputations. She was at her daughter's [place] party a few weeks back, and as night came and became cool >cold out the aching in her legs became much worse.  She at some times feels like walking helps the aching sensation, but also when walks stairs for example feels like her calves hurt, painful. She has also noted R foot very lateral edge will feel like pins/needles when walking (this is rare) We pursued LE vascular studies No DVTs b/l, ABI and arterial studies were normal.   She called 06/21/20 with about 3 weeks of palpitations  TODAY About 3 weeks ago she started noticing palpitations.  This seems to correlate with an  acute up-tick in work stress, though not only feeling them at work. They can happen at rest, in the evenings or at work.  Sometimes she wakes feeling like she has been running a race all night. No CP She walks for exercise nothing more vigorous then that. She describes the palpitations as brief lasting a few seconds though can be on/off for 3 minutes sometiems. They do not seem positional or exertionally triggered. When she has them they seem to radiate/feels them in her throat as well, and reflexively takes a quick deep breath in. They do not  make her feel lightheaded or near syncopal.  She has not had syncope They are slightly reminiscent of her VT though she says 20% of the severity that her VT felt like, and without the "black sensation in her vision" that she had with the VT.  Some time ago she had mentioned to her GYN that she felt a bit depressed/down about the time of her period and was Rx Buspar but never started it.  Since this seemed to be associated with increased work stress she went ahead and started the buspar about 10 days ago.  She had her menses 1 week ago and is certain she is not pregnant.  She has not had an awareness of her palpitations in the last 5 days.  Notes by her watch her HR sometimes in the 40's with no symptoms, and sometimes 140's also without symptoms. No noted observation of her HR with her palpitations   Past Medical History:  Diagnosis Date  . History of chicken pox   . Thyroid cyst 2004   After having her daughter but resolved  . VT (ventricular tachycardia) ([facility])     Past Surgical History:  Procedure Laterality Date  . ELECTROPHYSIOLOGIC STUDY N/A 09/27/2015   Procedure: PVC Ablation;  Surgeon: Hillis Range, MD;  Location: [facility] INVASIVE CV LAB;  Service: Cardiovascular;  Laterality: N/A;    Current Outpatient Medications  Medication Sig Dispense Refill  . cholecalciferol (VITAMIN D) 1000 UNITS tablet Take 1,000 Units by mouth daily.    . flecainide (TAMBOCOR) 100 MG tablet Take 1 tablet (100 mg total) by mouth 2 (two) times daily. 180 tablet 3  . levonorgestrel (MIRENA) 20 MCG/24HR IUD 1 each by Intrauterine route once. Implanted April 2018    . valACYclovir (VALTREX) 1000 MG tablet Take 1 tablet by mouth 2 (two) times daily as needed.     No current facility-administered medications for this visit.    Allergies:   Patient has no known allergies.   Social History:  The patient  reports that she has never smoked. She has never used smokeless tobacco. She reports that she does not  drink alcohol and does not use drugs.   Family History:  The patient's family history includes Arthritis in her mother; Birth defects in her maternal grandfather; Cancer in her paternal grandfather; Diabetes in her maternal grandmother, mother, and paternal grandmother; Early death in her father.  ROS:  Please see the history of present illness.  All other systems are reviewed and otherwise negative.   PHYSICAL EXAM:  VS:  There were no vitals taken for this visit. BMI: There is no height or weight on file to calculate BMI. Well nourished, well developed, in no acute distress  HEENT: normocephalic, atraumatic  Neck: no JVD, carotid bruits or masses Cardiac:   RRR; no significant murmurs, no rubs, or gallops Lungs:  CTA b/l, no wheezing, rhonchi or rales  Abd: soft, nontender  MS: no deformity or atrophy Ext:  no edema Skin: warm and dry, no rash Neuro:  No gross deficits appreciated Psych: euthymic mood, full affect    EKG:  Done today and reviewed by myself:  06/22/20:   SR 65bpm, PR , QRS 27ms, QTc 416, unchanged from prior 02/22/20: SR 63bpm, PR , QRS 33ms, QTc , nonspecific ST/T changes, appears unchanged 06/02/18: SR 66bpm, PR , QRS 71ms, QTc , nonspecific T changes, unchanged from prior 12/28/16: SB, 58bpm, PR , QRS 28ms, QTc , nonspecific T changes look similar to previous  03/30/14: TTE LVEF 60-65% No significant valve disease  02/17/13: Stress myoview Normal myocardial perfusion study   Recent Labs: No results found for requested labs within last 8760 hours.  No results found for requested labs within last 8760 hours.   CrCl cannot be calculated (Patient's most recent lab result is older than the maximum 21 days allowed.).   Wt Readings from Last 3 Encounters:  02/22/20 165 lb (74.8 kg)  06/08/19 190 lb (86.2 kg)  06/08/19 189 lb 6.4 oz (85.9 kg)     Other studies reviewed: Additional studies/records reviewed today include:  summarized above  ASSESSMENT AND PLAN:  1. VT     on Flecainide, stable intervals     Record indicates intolerant of CCB, BB, she recalls feeling awful with metoprolol  Palpitations Not quite the same as her VT symptoms  Will have her wear a Zio AT given her h/o VT for 2 weeks, update her echo, get labs   ADDEND: 08/16/20 Monitor reviewed with Dr. Graciela Husbands.  Symptom strips correlate with PVCs PVC burden <1% Recommends no changes, and reassurance of the benign nature of her palpitations. Francis Dowse, PA-C   Disposition: 4-6 weeks with Dr. Graciela Husbands, sooner if needed  Current medicines are reviewed at length with the patient today.  The patient did not have any concerns regarding medicines.  Judith Blonder, PA-C 06/21/2020 12:43 PM     [facility] [address] [place] [address] [phone] (office)  [phone] (fax)

## 2020-06-22 ENCOUNTER — Ambulatory Visit (INDEPENDENT_AMBULATORY_CARE_PROVIDER_SITE_OTHER): Payer: BC Managed Care – PPO | Admitting: Physician Assistant

## 2020-06-22 ENCOUNTER — Other Ambulatory Visit: Payer: Self-pay

## 2020-06-22 ENCOUNTER — Telehealth: Payer: Self-pay | Admitting: Radiology

## 2020-06-22 VITALS — BP 102/64 | HR 65 | Ht 69.0 in | Wt 167.0 lb

## 2020-06-22 DIAGNOSIS — R002 Palpitations: Secondary | ICD-10-CM

## 2020-06-22 DIAGNOSIS — Z5181 Encounter for therapeutic drug level monitoring: Secondary | ICD-10-CM

## 2020-06-22 DIAGNOSIS — I472 Ventricular tachycardia, unspecified: Secondary | ICD-10-CM

## 2020-06-22 LAB — CBC
Hematocrit: 44.1 % (ref 34.0–46.6)
Hemoglobin: 15.3 g/dL (ref 11.1–15.9)
MCH: 31.7 pg (ref 26.6–33.0)
MCHC: 34.7 g/dL (ref 31.5–35.7)
MCV: 91 fL (ref 79–97)
Platelets: 190 10*3/uL (ref 150–450)
RBC: 4.83 x10E6/uL (ref 3.77–5.28)
RDW: 11.6 % — ABNORMAL LOW (ref 11.7–15.4)
WBC: 6.9 10*3/uL (ref 3.4–10.8)

## 2020-06-22 LAB — BASIC METABOLIC PANEL
BUN/Creatinine Ratio: 14 (ref 9–23)
BUN: 11 mg/dL (ref 6–24)
CO2: 26 mmol/L (ref 20–29)
Calcium: 9.9 mg/dL (ref 8.7–10.2)
Chloride: 100 mmol/L (ref 96–106)
Creatinine, Ser: 0.81 mg/dL (ref 0.57–1.00)
GFR calc Af Amer: 104 mL/min/{1.73_m2} (ref >59)
GFR calc non Af Amer: 90 mL/min/{1.73_m2} (ref >59)
Glucose: 90 mg/dL (ref 65–99)
Potassium: 4.3 mmol/L (ref 3.5–5.2)
Sodium: 138 mmol/L (ref 134–144)

## 2020-06-22 LAB — MAGNESIUM: Magnesium: 2 mg/dL (ref 1.6–2.3)

## 2020-06-22 LAB — TSH: TSH: 3.34 u[IU]/mL (ref 0.450–4.500)

## 2020-06-22 NOTE — Patient Instructions (Signed)
Medication Instructions:  Your physician recommends that you continue on your current medications as directed. Please refer to the Current Medication list given to you today.  *If you need a refill on your cardiac medications before your next appointment, please call your pharmacy*   Lab Work: BM[facility] [place]   If you have labs (blood work) drawn today and your tests are completely normal, you will receive your results only by:  [place] (if you have MyChart) OR  A paper copy in the mail If you have any lab test that is abnormal or we need to change your treatment, we will call you to review the results.   Testing/Procedures: Your physician has requested that you have an echocardiogram. Echocardiography is a painless test that uses sound waves to create images of your heart. It provides your doctor with information about the size and shape of your heart and how well your hearts chambers and valves are working. This procedure takes approximately one hour. There are no restrictions for this procedure.  Your physician has recommended that you wear an event monitor. Event monitors are medical devices that record the hearts electrical activity. Doctors most often [place] these monitors to diagnose arrhythmias. Arrhythmias are problems with the speed or rhythm of the heartbeat. The monitor is a small, portable device. You can wear one while you do your normal daily activities. This is usually used to diagnose what is causing palpitations/syncope (passing out).      Follow-Up: At [facility], you and your health needs are our priority.  As part of our continuing mission to provide you with exceptional heart care, we have created designated Provider Care Teams.  These Care Teams include your primary Cardiologist (physician) and Advanced Practice Providers (APPs -  Physician Assistants and Nurse Practitioners) who all work together to provide you with the care you need, when you need  it.  We recommend signing up for the patient portal called "MyChart".  Sign up information is provided on this After Visit Summary.  MyChart is used to connect with patients for Virtual Visits (Telemedicine).  Patients are able to view lab/test results, encounter notes, upcoming appointments, etc.  Non-urgent messages can be sent to your provider as well.   To learn more about what you can do with MyChart, go to [email or web address].    Your next appointment:   4-6 week(s)  The format for your next appointment:   In Person  Provider:   You may see Dr. Caryl Comes or one of the following Advanced Practice Providers on your designated Care Team:     Tommye Standard, PA-C     Other Instructions  ZIO AT Long term monitor-Live Telemetry  Your physician has requested you wear a ZIO patch monitor for 14__ days.  This is a single patch monitor. [employer] supplies one patch monitor per enrollment. Additional stickers are not available.  Please do not apply patch if you will be having a Nuclear Stress Test, Echocardiogram, Cardiac CT, MRI, or Chest Xray during the time frame you would be wearing the monitor. The patch cannot be worn during these tests. You cannot remove and re-apply the ZIO AT patch monitor.   Your ZIO patch monitor will be sent Fed Ex from [employer] directly to your home address. The monitor may also be mailed to a PO BOX if home delivery is not available. It may take 3-5 days to receive your monitor after you have been enrolled.  Once you have received  you monitor, please review enclosed instructions. Your monitor has already been registered assigning a specific monitor serial # to you.   Applying the monitor  Shave hair from upper left chest.  Hold abrader disc by orange tab. Rub abrader in 40 strokes over left upper chest as indicated in your monitor instructions.  Clean area with 4 enclosed alcohol pads. Use all pads to ensure the area is cleaned thoroughly. Let dry.   Apply patch as indicated in monitor instructions. Patch will be placed under collarbone on left side of chest with arrow pointing upward.  Rub patch adhesive wings for 2 minutes. Remove the white label marked "1". Remove the white label marked "2". Rub patch adhesive wings for 2 additional minutes.  While looking in a mirror, press and release button in center of patch. A small green light will flash 3-4 times. This will be your only indicator the monitor has been turned on.  Do not shower for the first 24 hours. You may shower after the first 24 hours.  Press the button if you feel a symptom. You will hear a small click. Record Date, Time and Symptom in the Patient Log.   Starting the Gateway  In your kit there is a Hydrographic surveyor box the size of a cellphone. This is Airline pilot. It transmits all your recorded data to [facility]. This box must stay within 10 feet of you at all times. Open the box and push the * button. There will be a light that blinks orange and then green a few times. When the light stops blinking, the Gateway is connected to the ZIO patch.  Call [employer] at [phone] to confirm your monitor is transmitting.   Returning your monitor  Remove your patch and place it inside the [place]. In the lower half of the Gateway there is a white bag with prepaid postage on it. Place Gateway in bag and seal. Mail package back to [place] as soon as possible. Your physician should have your final report approximately 7 days after you have mailed back your monitor.   Call [place] at [phone] if you have questions regarding your ZIO AT patch monitor. Call them immediately if you see an orange light blinking on your monitor.  If your monitor falls off in less than 4 days contact our Monitor department at [phone]. If your monitor becomes loose or falls off after 4 days call [employer] at [phone] for suggestions on securing your monitor.

## 2020-06-22 NOTE — Telephone Encounter (Signed)
Enrolled patient for a 14 day Zio AT monitor to be mailed to patients home.  

## 2020-06-22 NOTE — Addendum Note (Signed)
Addended by: Marylou Flesher on: 06/22/2020 08:57 AM   Modules accepted: Orders

## 2020-06-24 ENCOUNTER — Ambulatory Visit (INDEPENDENT_AMBULATORY_CARE_PROVIDER_SITE_OTHER): Payer: BC Managed Care – PPO

## 2020-06-24 DIAGNOSIS — I472 Ventricular tachycardia, unspecified: Secondary | ICD-10-CM

## 2020-06-29 ENCOUNTER — Other Ambulatory Visit: Payer: Self-pay

## 2020-06-29 ENCOUNTER — Ambulatory Visit (HOSPITAL_COMMUNITY): Payer: BC Managed Care – PPO | Attending: Internal Medicine

## 2020-06-29 DIAGNOSIS — I472 Ventricular tachycardia, unspecified: Secondary | ICD-10-CM

## 2020-06-29 LAB — ECHOCARDIOGRAM COMPLETE
Area-P 1/2: 3.3 cm2
S' Lateral: 2.7 cm

## 2020-07-01 ENCOUNTER — Telehealth: Payer: Self-pay | Admitting: Internal Medicine

## 2020-07-01 ENCOUNTER — Encounter: Payer: BC Managed Care – PPO | Admitting: Family Medicine

## 2020-07-01 NOTE — Telephone Encounter (Signed)
Our [employer], Shanda Howells, received an email from [place] on 06/29/20 regarding this patient. She brought me a copy of the email to review. She is a Solicitor patient.   This states that the patient was on day 4 of 14 of wear of her monitor and that she is nearing the maximum threshold for button presses for her monitor. [employer] advised they had attempted to contact the patient by phone, but her voice mail box was full.   "Once the threshold has been met the additional button presses will be stored on the device and will be provided on the final report. Please note the AT device will continue to auto trigger, anything abnormal will still be relayed and transmitted depending on the severity. If the prescriber would like to have a replacement device for the conclusion of wear, one can be provided. "  Per: [place] [phone]- phone [phone] [email or web address]   To [facility] staff to advise of the information above.

## 2020-07-05 NOTE — Telephone Encounter (Signed)
Contacted [employer] regarding max button pushes on ZIO AT.  They have been successful reaching patient.  Patient aware maximum button pushes have been reached and that monitor will continue to record and transmit data to [facility].

## 2020-07-11 ENCOUNTER — Ambulatory Visit (INDEPENDENT_AMBULATORY_CARE_PROVIDER_SITE_OTHER): Payer: BC Managed Care – PPO | Admitting: Family Medicine

## 2020-07-11 ENCOUNTER — Encounter: Payer: Self-pay | Admitting: Family Medicine

## 2020-07-11 ENCOUNTER — Other Ambulatory Visit: Payer: Self-pay

## 2020-07-11 VITALS — BP 100/60 | HR 75 | Temp 97.7°F | Ht 69.0 in | Wt 167.9 lb

## 2020-07-11 DIAGNOSIS — K824 Cholesterolosis of gallbladder: Secondary | ICD-10-CM

## 2020-07-11 DIAGNOSIS — E041 Nontoxic single thyroid nodule: Secondary | ICD-10-CM | POA: Diagnosis not present

## 2020-07-11 DIAGNOSIS — Z0000 Encounter for general adult medical examination without abnormal findings: Secondary | ICD-10-CM | POA: Diagnosis not present

## 2020-07-11 DIAGNOSIS — J301 Allergic rhinitis due to pollen: Secondary | ICD-10-CM

## 2020-07-11 MED ORDER — AZELASTINE-FLUTICASONE 137-50 MCG/ACT NA SUSP: NASAL | 3 refills | Status: DC

## 2020-07-11 NOTE — Progress Notes (Signed)
Established Patient Office Visit  Subjective:  Patient ID: Heather Rice, female    DOB: [birth year]  Age: 44 y.o. MRN: [id number]  CC:  Chief Complaint  Patient presents with  . Transitions Of Care    TOC from Dr. Dayton Martes, concerns about allergies.     HPI Heather Rice presents for physical exam and follow-up of allergy rhinitis with postnasal drip sneezing and watery rhinorrhea. History of thyroid nodule followed by endocrinology. She also has a history of a gallbladder polyp that needs follow-up. It is not causing her any problems at this point. 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 Well woman care through GYN. She is married and lives with her husband. She does work outside the home. She does not smoke drink alcohol or use illicit drugs. She has regular dental care. She is hesitant to have the Covid vaccine.  Past Medical History:  Diagnosis Date  . History of chicken pox   . Thyroid cyst 2004   After having her daughter but resolved  . VT (ventricular tachycardia) (HCC)     Past Surgical History:  Procedure Laterality Date  . ELECTROPHYSIOLOGIC STUDY N/A 09/27/2015   Procedure: PVC Ablation;  Surgeon: Hillis Range, MD;  Location: [facility] INVASIVE CV LAB;  Service: Cardiovascular;  Laterality: N/A;    Family History  Problem Relation Age of Onset  . Diabetes Mother   . Arthritis Mother   . Early death Father        MVA  . Diabetes Maternal Grandmother   . Birth defects Maternal Grandfather   . Diabetes Paternal Grandmother   . Cancer Paternal Grandfather        throat    Social History   Socioeconomic History  . Marital status: Married    Spouse name: Not on file  . Number of children: Not on file  . Years of education: Not on file  . Highest education level: Not on file  Occupational History  . Not on file  Tobacco Use  . Smoking status: Never Smoker  . Smokeless tobacco: Never Used  Vaping Use  . Vaping Use: Never used    Substance and Sexual Activity  . Alcohol use: No  . Drug use: No  . Sexual activity: Yes    Birth control/protection: I.U.D.  Other Topics Concern  . Not on file  Social History Narrative  . Not on file   Social Determinants of Health   Financial Resource Strain:   . Difficulty of Paying Living Expenses: Not on file  Food Insecurity:   . Worried About Programme researcher, broadcasting/film/video in the Last Year: Not on file  . Ran Out of Food in the Last Year: Not on file  Transportation Needs:   . Lack of Transportation (Medical): Not on file  . Lack of Transportation (Non-Medical): Not on file  Physical Activity:   . Days of Exercise per Week: Not on file  . Minutes of Exercise per Session: Not on file  Stress:   . Feeling of Stress : Not on file  Social Connections:   . Frequency of Communication with Friends and Family: Not on file  . Frequency of Social Gatherings with Friends and Family: Not on file  . Attends Religious Services: Not on file  . Active Member of Clubs or Organizations: Not on file  . Attends Banker Meetings: Not on file  . Marital Status: Not on file  Intimate Partner Violence:   . Fear of Current  or Ex-Partner: Not on file  . Emotionally Abused: Not on file  . Physically Abused: Not on file  . Sexually Abused: Not on file    Outpatient Medications Prior to Visit  Medication Sig Dispense Refill  . busPIRone (BUSPAR) 5 MG tablet Take 5 mg by mouth 2 (two) times daily.    . cholecalciferol (VITAMIN D) 1000 UNITS tablet Take 1,000 Units by mouth daily.    . flecainide (TAMBOCOR) 100 MG tablet Take 1 tablet (100 mg total) by mouth 2 (two) times daily. 180 tablet 3  . levonorgestrel (MIRENA) 20 MCG/24HR IUD 1 each by Intrauterine route once. Implanted April 2018    . valACYclovir (VALTREX) 1000 MG tablet Take 1 tablet by mouth 2 (two) times daily as needed.     No facility-administered medications prior to visit.    No Known Allergies  ROS Review of  Systems  Constitutional: Negative.   HENT: Positive for postnasal drip, rhinorrhea and sneezing.   Eyes: Negative for photophobia and visual disturbance.  Respiratory: Negative.  Negative for shortness of breath.   Cardiovascular: Positive for palpitations. Negative for chest pain.  Gastrointestinal: Negative.   Endocrine: Negative for polyphagia and polyuria.  Genitourinary: Negative.   Musculoskeletal: Negative for gait problem and joint swelling.  Neurological: Negative.   Psychiatric/Behavioral: Negative.       Objective:    Physical Exam Vitals and nursing note reviewed.  Constitutional:      General: She is not in acute distress.    Appearance: Normal appearance. She is normal weight. She is not ill-appearing, toxic-appearing or diaphoretic.  HENT:     Head: Normocephalic and atraumatic.     Right Ear: Tympanic membrane, ear canal and external ear normal.     Left Ear: Ear canal and external ear normal.  Eyes:     General: No scleral icterus.       Right eye: No discharge.        Left eye: No discharge.     Extraocular Movements: Extraocular movements intact.     Conjunctiva/sclera: Conjunctivae normal.     Pupils: Pupils are equal, round, and reactive to light.  Cardiovascular:     Rate and Rhythm: Normal rate and regular rhythm.  Pulmonary:     Effort: Pulmonary effort is normal.     Breath sounds: Normal breath sounds.  Abdominal:     General: Bowel sounds are normal.  Musculoskeletal:     Cervical back: Normal range of motion. No tenderness.     Right lower leg: No edema.     Left lower leg: No edema.  Lymphadenopathy:     Cervical: No cervical adenopathy.  Skin:    General: Skin is warm and dry.  Neurological:     Mental Status: She is alert and oriented to person, place, and time.  Psychiatric:        Mood and Affect: Mood normal.        Behavior: Behavior normal.     BP 100/60   Pulse 75   Temp 97.7 F (36.5 C) (Tympanic)   Ht 5\' 9"  (1.753 m)    Wt 167 lb 14.4 oz (76.2 kg)   SpO2 97%   BMI 24.79 kg/m  Wt Readings from Last 3 Encounters:  07/11/20 167 lb 14.4 oz (76.2 kg)  06/22/20 167 lb (75.8 kg)  02/22/20 165 lb (74.8 kg)     Health Maintenance Due  Topic Date Due  . Hepatitis C Screening  Never done  .  PAP SMEAR-Modifier  07/13/2019    There are no preventive care reminders to display for this patient.  Lab Results  Component Value Date   TSH 3.340 06/22/2020   Lab Results  Component Value Date   WBC 6.9 06/22/2020   HGB 15.3 06/22/2020   HCT 44.1 06/22/2020   MCV 91 06/22/2020   PLT 190 06/22/2020   Lab Results  Component Value Date   NA 138 06/22/2020   K 4.3 06/22/2020   CO2 26 06/22/2020   GLUCOSE 90 06/22/2020   BUN 11 06/22/2020   CREATININE 0.81 06/22/2020   BILITOT 0.5 06/08/2019   ALKPHOS 59 06/08/2019   AST 13 06/08/2019   ALT 10 06/08/2019   PROT 6.7 06/08/2019   ALBUMIN 4.5 06/08/2019   CALCIUM 9.9 06/22/2020   ANIONGAP 6 04/16/2015   GFR 92.17 06/08/2019   Lab Results  Component Value Date   CHOL 196 06/08/2019   Lab Results  Component Value Date   HDL 56.50 06/08/2019   Lab Results  Component Value Date   LDLCALC 123 (H) 06/08/2019   Lab Results  Component Value Date   TRIG 78.0 06/08/2019   Lab Results  Component Value Date   CHOLHDL 3 06/08/2019   Lab Results  Component Value Date   HGBA1C 4.8 08/28/2017      Assessment & Plan:   Problem List Items Addressed This Visit      Respiratory   Seasonal allergic rhinitis due to pollen - Primary   Relevant Medications   Azelastine-Fluticasone 137-50 MCG/ACT SUSP     Digestive   Gallbladder polyp   Relevant Orders   US Abdomen Limited     Endocrine   Thyroid nodule   Relevant Orders   Ambulatory referral to Endocrinology    Other Visit Diagnoses    Healthcare maintenance       Relevant Orders   Comprehensive metabolic panel   Lipid panel      Meds ordered this encounter  Medications  .  Azelastine-Fluticasone 137-50 MCG/ACT SUSP    Sig: One spray in each nare twice daily.    Dispense:  23 g    Refill:  3    Follow-up: Return in about 1 year (around 07/11/2021).  Will return fasting for blood work. Information given on health maintenance and disease prevention. Strongly advised her to have the Covid vaccine. We will follow-up as needed.  Mliss Sax, MD

## 2020-07-11 NOTE — Patient Instructions (Signed)
Allergic Rhinitis, Adult Allergic rhinitis is an allergic reaction that affects the mucous membrane inside the nose. It causes sneezing, a runny or stuffy nose, and the feeling of mucus going down the back of the throat (postnasal drip). Allergic rhinitis can be mild to severe. There are two types of allergic rhinitis:  Seasonal. This type is also called hay fever. It happens only during certain seasons.  Perennial. This type can happen at any time of the year. What are the causes? This condition happens when the body's defense system (immune system) responds to certain harmless substances called allergens as though they were germs.  Seasonal allergic rhinitis is triggered by pollen, which can come from grasses, trees, and weeds. Perennial allergic rhinitis may be caused by:  House dust mites.  Pet dander.  Mold spores. What are the signs or symptoms? Symptoms of this condition include:  Sneezing.  Runny or stuffy nose (nasal congestion).  Postnasal drip.  Itchy nose.  Tearing of the eyes.  Trouble sleeping.  Daytime sleepiness. How is this diagnosed? This condition may be diagnosed based on:  Your medical history.  A physical exam.  Tests to check for related conditions, such as: ? Asthma. ? Pink eye. ? Ear infection. ? Upper respiratory infection.  Tests to find out which allergens trigger your symptoms. These may include skin or blood tests. How is this treated? There is no cure for this condition, but treatment can help control symptoms. Treatment may include:  Taking medicines that block allergy symptoms, such as antihistamines. Medicine may be given as a shot, nasal spray, or pill.  Avoiding the allergen.  Desensitization. This treatment involves getting ongoing shots until your body becomes less sensitive to the allergen. This treatment may be done if other treatments do not help.  If taking medicine and avoiding the allergen does not work, new, stronger  medicines may be prescribed. Follow these instructions at home:  Find out what you are allergic to. Common allergens include smoke, dust, and pollen.  Avoid the things you are allergic to. These are some things you can do to help avoid allergens: ? Replace carpet with wood, tile, or vinyl flooring. Carpet can trap dander and dust. ? Do not smoke. Do not allow smoking in your home. ? Change your heating and air conditioning filter at least once a month. ? During allergy season:  Keep windows closed as much as possible.  Plan outdoor activities when pollen counts are lowest. This is usually during the evening hours.  When coming indoors, change clothing and shower before sitting on furniture or bedding.  Take over-the-counter and prescription medicines only as told by your health care provider.  Keep all follow-up visits as told by your health care provider. This is important. Contact a health care provider if:  You have a fever.  You develop a persistent cough.  You make whistling sounds when you breathe (you wheeze).  Your symptoms interfere with your normal daily activities. Get help right away if:  You have shortness of breath. Summary  This condition can be managed by taking medicines as directed and avoiding allergens.  Contact your health care provider if you develop a persistent cough or fever.  During allergy season, keep windows closed as much as possible. This information is not intended to replace advice given to you by your health care provider. Make sure you discuss any questions you have with your health care provider. Document Revised: 09/13/2017 Document Reviewed: 11/08/2016 Elsevier Patient Education  [phone]  [place] Maintenance, Female Adopting a healthy lifestyle and getting preventive care are important in promoting health and wellness. Ask your health care provider about:  The right schedule for you to have regular tests and  exams.  Things you can do on your own to prevent diseases and keep yourself healthy. What should I know about diet, weight, and exercise? Eat a healthy diet   Eat a diet that includes plenty of vegetables, fruits, low-fat dairy products, and lean protein.  Do not eat a lot of foods that are high in solid fats, added sugars, or sodium. Maintain a healthy weight Body mass index (BMI) is used to identify weight problems. It estimates body fat based on height and weight. Your health care provider can help determine your BMI and help you achieve or maintain a healthy weight. Get regular exercise Get regular exercise. This is one of the most important things you can do for your health. Most adults should:  Exercise for at least 150 minutes each week. The exercise should increase your heart rate and make you sweat (moderate-intensity exercise).  Do strengthening exercises at least twice a week. This is in addition to the moderate-intensity exercise.  Spend less time sitting. Even light physical activity can be beneficial. Watch cholesterol and blood lipids Have your blood tested for lipids and cholesterol at 42 years of age, then have this test every 5 years. Have your cholesterol levels checked more often if:  Your lipid or cholesterol levels are high.  You are older than 42 years of age.  You are at high risk for heart disease. What should I know about cancer screening? Depending on your health history and family history, you may need to have cancer screening at various ages. This may include screening for:  Breast cancer.  Cervical cancer.  Colorectal cancer.  Skin cancer.  Lung cancer. What should I know about heart disease, diabetes, and high blood pressure? Blood pressure and heart disease  High blood pressure causes heart disease and increases the risk of stroke. This is more likely to develop in people who have high blood pressure readings, are of African descent, or are  overweight.  Have your blood pressure checked: ? Every 3-5 years if you are 22-24 years of age. ? Every year if you are 78 years old or older. Diabetes Have regular diabetes screenings. This checks your fasting blood sugar level. Have the screening done:  Once every three years after age 62 if you are at a normal weight and have a low risk for diabetes.  More often and at a younger age if you are overweight or have a high risk for diabetes. What should I know about preventing infection? Hepatitis B If you have a higher risk for hepatitis B, you should be screened for this virus. Talk with your health care provider to find out if you are at risk for hepatitis B infection. Hepatitis C Testing is recommended for:  Everyone born from [birth year] through 1963.  Anyone with known risk factors for hepatitis C. Sexually transmitted infections (STIs)  Get screened for STIs, including gonorrhea and chlamydia, if: ? You are sexually active and are younger than 42 years of age. ? You are older than 42 years of age and your health care provider tells you that you are at risk for this type of infection. ? Your sexual activity has changed since you were last screened, and you are at increased risk for chlamydia or gonorrhea. Ask your  health care provider if you are at risk.  Ask your health care provider about whether you are at high risk for HIV. Your health care provider may recommend a prescription medicine to help prevent HIV infection. If you choose to take medicine to prevent HIV, you should first get tested for HIV. You should then be tested every 3 months for as long as you are taking the medicine. Pregnancy  If you are about to stop having your period (premenopausal) and you may become pregnant, seek counseling before you get pregnant.  Take 400 to 800 micrograms (mcg) of folic acid every day if you become pregnant.  Ask for birth control (contraception) if you want to prevent  pregnancy. Osteoporosis and menopause Osteoporosis is a disease in which the bones lose minerals and strength with aging. This can result in bone fractures. If you are 55 years old or older, or if you are at risk for osteoporosis and fractures, ask your health care provider if you should:  Be screened for bone loss.  Take a calcium or vitamin D supplement to lower your risk of fractures.  Be given hormone replacement therapy (HRT) to treat symptoms of menopause. Follow these instructions at home: Lifestyle  Do not use any products that contain nicotine or tobacco, such as cigarettes, e-cigarettes, and chewing tobacco. If you need help quitting, ask your health care provider.  Do not use street drugs.  Do not share needles.  Ask your health care provider for help if you need support or information about quitting drugs. Alcohol use  Do not drink alcohol if: ? Your health care provider tells you not to drink. ? You are pregnant, may be pregnant, or are planning to become pregnant.  If you drink alcohol: ? Limit how much you use to 0-1 drink a day. ? Limit intake if you are breastfeeding.  Be aware of how much alcohol is in your drink. In the [place] one drink equals one 12 oz bottle of beer (355 mL), one 5 oz glass of wine (148 mL), or one 1 oz glass of hard liquor (44 mL). General instructions  Schedule regular health, dental, and eye exams.  Stay current with your vaccines.  Tell your health care provider if: ? You often feel depressed. ? You have ever been abused or do not feel safe at home. Summary  Adopting a healthy lifestyle and getting preventive care are important in promoting health and wellness.  Follow your health care provider's instructions about healthy diet, exercising, and getting tested or screened for diseases.  Follow your health care provider's instructions on monitoring your cholesterol and blood pressure. This information is not intended to replace  advice given to you by your health care provider. Make sure you discuss any questions you have with your health care provider. Document Revised: 09/24/2018 Document Reviewed: 09/24/2018 Elsevier Patient Education  2020 Elsevier Inc.  Preventive Care 34-30 Years Old, Female Preventive care refers to visits with your health care provider and lifestyle choices that can promote health and wellness. This includes:  A yearly physical exam. This may also be called an annual well check.  Regular dental visits and eye exams.  Immunizations.  Screening for certain conditions.  Healthy lifestyle choices, such as eating a healthy diet, getting regular exercise, not using drugs or products that contain nicotine and tobacco, and limiting alcohol use. What can I expect for my preventive care visit? Physical exam Your health care provider will check your:  Height and weight.  This may be used to calculate body mass index (BMI), which tells if you are at a healthy weight.  Heart rate and blood pressure.  Skin for abnormal spots. Counseling Your health care provider may ask you questions about your:  Alcohol, tobacco, and drug use.  Emotional well-being.  Home and relationship well-being.  Sexual activity.  Eating habits.  Work and work Statistician.  Method of birth control.  Menstrual cycle.  Pregnancy history. What immunizations do I need?  Influenza (flu) vaccine  This is recommended every year. Tetanus, diphtheria, and pertussis (Tdap) vaccine  You may need a Td booster every 10 years. Varicella (chickenpox) vaccine  You may need this if you have not been vaccinated. Zoster (shingles) vaccine  You may need this after age 11. Measles, mumps, and rubella (MMR) vaccine  You may need at least one dose of MMR if you were born in 1957 or later. You may also need a second dose. Pneumococcal conjugate (PCV13) vaccine  You may need this if you have certain conditions and were  not previously vaccinated. Pneumococcal polysaccharide (PPSV23) vaccine  You may need one or two doses if you smoke cigarettes or if you have certain conditions. Meningococcal conjugate (MenACWY) vaccine  You may need this if you have certain conditions. Hepatitis A vaccine  You may need this if you have certain conditions or if you travel or work in places where you may be exposed to hepatitis A. Hepatitis B vaccine  You may need this if you have certain conditions or if you travel or work in places where you may be exposed to hepatitis B. Haemophilus influenzae type b (Hib) vaccine  You may need this if you have certain conditions. Human papillomavirus (HPV) vaccine  If recommended by your health care provider, you may need three doses over 6 months. You may receive vaccines as individual doses or as more than one vaccine together in one shot (combination vaccines). Talk with your health care provider about the risks and benefits of combination vaccines. What tests do I need? Blood tests  Lipid and cholesterol levels. These may be checked every 5 years, or more frequently if you are over 38 years old.  Hepatitis C test.  Hepatitis B test. Screening  Lung cancer screening. You may have this screening every year starting at age 30 if you have a 30-pack-year history of smoking and currently smoke or have quit within the past 15 years.  Colorectal cancer screening. All adults should have this screening starting at age 2 and continuing until age 72. Your health care provider may recommend screening at age 17 if you are at increased risk. You will have tests every 1-10 years, depending on your results and the type of screening test.  Diabetes screening. This is done by checking your blood sugar (glucose) after you have not eaten for a while (fasting). You may have this done every 1-3 years.  Mammogram. This may be done every 1-2 years. Talk with your health care provider about when  you should start having regular mammograms. This may depend on whether you have a family history of breast cancer.  BRCA-related cancer screening. This may be done if you have a family history of breast, ovarian, tubal, or peritoneal cancers.  Pelvic exam and Pap test. This may be done every 3 years starting at age 104. Starting at age 72, this may be done every 5 years if you have a Pap test in combination with an HPV test. Other tests  Sexually transmitted disease (STD) testing.  Bone density scan. This is done to screen for osteoporosis. You may have this scan if you are at high risk for osteoporosis. Follow these instructions at home: Eating and drinking  Eat a diet that includes fresh fruits and vegetables, whole grains, lean protein, and low-fat dairy.  Take vitamin and mineral supplements as recommended by your health care provider.  Do not drink alcohol if: ? Your health care provider tells you not to drink. ? You are pregnant, may be pregnant, or are planning to become pregnant.  If you drink alcohol: ? Limit how much you have to 0-1 drink a day. ? Be aware of how much alcohol is in your drink. In the [place] one drink equals one 12 oz bottle of beer (355 mL), one 5 oz glass of wine (148 mL), or one 1 oz glass of hard liquor (44 mL). Lifestyle  Take daily care of your teeth and gums.  Stay active. Exercise for at least 30 minutes on 5 or more days each week.  Do not use any products that contain nicotine or tobacco, such as cigarettes, e-cigarettes, and chewing tobacco. If you need help quitting, ask your health care provider.  If you are sexually active, practice safe sex. Use a condom or other form of birth control (contraception) in order to prevent pregnancy and STIs (sexually transmitted infections).  If told by your health care provider, take low-dose aspirin daily starting at age 9. What's next?  Visit your health care provider once a year for a well check  visit.  Ask your health care provider how often you should have your eyes and teeth checked.  Stay up to date on all vaccines. This information is not intended to replace advice given to you by your health care provider. Make sure you discuss any questions you have with your health care provider. Document Revised: 06/12/2018 Document Reviewed: 06/12/2018 Elsevier Patient Education  2020 [employer].

## 2020-07-12 ENCOUNTER — Other Ambulatory Visit: Payer: Self-pay | Admitting: Endocrinology

## 2020-07-12 DIAGNOSIS — E049 Nontoxic goiter, unspecified: Secondary | ICD-10-CM | POA: Diagnosis not present

## 2020-07-15 ENCOUNTER — Telehealth: Payer: Self-pay | Admitting: Family Medicine

## 2020-07-15 NOTE — Telephone Encounter (Signed)
Patient is calling and wanted to speak to someone regarding medication that was recently prescribed, please advise. CB is [phone]

## 2020-07-15 NOTE — Telephone Encounter (Signed)
PA for Azelastine-fluticasone 137-50 mcg  Was submitted through cover my meds and approved from 06/15/20 -101/22.  KEY: [id number], case # [id number].  Pharmacy notified and my chart message sent to patient due to unable to reach by phone.  Dm/cma

## 2020-07-15 NOTE — Telephone Encounter (Signed)
Tried calling patient and unable to leave message due to no VM set up and no one answered.    We just received the PA for the Azelastine-Fluticasone 137-62mcg Will get that sent to the insurance today.   Dm/cma

## 2020-07-19 ENCOUNTER — Telehealth: Payer: Self-pay

## 2020-07-19 ENCOUNTER — Other Ambulatory Visit: Payer: BC Managed Care – PPO

## 2020-07-19 NOTE — Telephone Encounter (Signed)
Received a letter form [facility] stating the the Azelastine-Fluticasone wasn't covered, although I did get it covered through cover my meds from 06/15/20 -07/15/21.   Spoke to patient to see if she was able to get the Rx Azelastine-Fluticasone form the pharmacy and she states that she did get it for a co-pay of $15.   Will have letter scanned just in case there is ever a question that comes back to it.  Dm/cma

## 2020-07-25 ENCOUNTER — Other Ambulatory Visit: Payer: Self-pay

## 2020-07-25 ENCOUNTER — Other Ambulatory Visit (INDEPENDENT_AMBULATORY_CARE_PROVIDER_SITE_OTHER): Payer: BC Managed Care – PPO

## 2020-07-25 DIAGNOSIS — I472 Ventricular tachycardia, unspecified: Secondary | ICD-10-CM | POA: Insufficient documentation

## 2020-07-25 DIAGNOSIS — Z0000 Encounter for general adult medical examination without abnormal findings: Secondary | ICD-10-CM

## 2020-07-25 DIAGNOSIS — R002 Palpitations: Secondary | ICD-10-CM | POA: Insufficient documentation

## 2020-07-25 LAB — URINALYSIS, ROUTINE W REFLEX MICROSCOPIC
Bilirubin Urine: NEGATIVE
Hgb urine dipstick: NEGATIVE
Ketones, ur: NEGATIVE
Leukocytes,Ua: NEGATIVE
Nitrite: NEGATIVE
RBC / HPF: NONE SEEN (ref 0–?)
Specific Gravity, Urine: 1.015 (ref 1.000–1.030)
Total Protein, Urine: NEGATIVE
Urine Glucose: NEGATIVE
Urobilinogen, UA: 0.2 (ref 0.0–1.0)
WBC, UA: NONE SEEN (ref 0–?)
pH: 7 (ref 5.0–8.0)

## 2020-07-25 LAB — LIPID PANEL
Cholesterol: 197 mg/dL (ref 0–200)
HDL: 68.8 mg/dL (ref >39.00)
LDL Cholesterol: 108 mg/dL — ABNORMAL HIGH (ref 0–99)
NonHDL: 128.22
Total CHOL/HDL Ratio: 3
Triglycerides: 100 mg/dL (ref 0.0–149.0)
VLDL: 20 mg/dL (ref 0.0–40.0)

## 2020-07-25 LAB — COMPREHENSIVE METABOLIC PANEL
ALT: 9 U/L (ref 0–35)
AST: 10 U/L (ref 0–37)
Albumin: 4.5 g/dL (ref 3.5–5.2)
Alkaline Phosphatase: 52 U/L (ref 39–117)
BUN: 11 mg/dL (ref 6–23)
CO2: 29 mEq/L (ref 19–32)
Calcium: 9.3 mg/dL (ref 8.4–10.5)
Chloride: 102 mEq/L (ref 96–112)
Creatinine, Ser: 0.76 mg/dL (ref 0.40–1.20)
GFR: 96.6 mL/min (ref >60.00)
Glucose, Bld: 87 mg/dL (ref 70–99)
Potassium: 4 mEq/L (ref 3.5–5.1)
Sodium: 137 mEq/L (ref 135–145)
Total Bilirubin: 0.6 mg/dL (ref 0.2–1.2)
Total Protein: 6.9 g/dL (ref 6.0–8.3)

## 2020-07-25 NOTE — Progress Notes (Signed)
Patient Care Team: Mliss Sax, MD as PCP - General (Family Medicine)   HPI  Heather Rice is a 44 y.o. 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 These were short nonsustained runs Effort to treat with calcium blockers and beta blockers were insufficient and so she ended up on  flecainide and metoprolol. No l onger on beta blockers   Exercise test July showed no evidence of ventricular tachycardia  ECGs have shown T-wave inversion from V4 variably back thorugh the data we reviewed to 2007  DATE TEST EF   9/16 cMRI  67 % Normal              SAECG 9/16 No ( 112/43/12)  DATE PR interval QRSduration Dose  8/20 174 86 100  10/21 156 92 100      Event Recorder personnally reviewed  Symptoms assoc with PVCs isolated with total burden < 1%   Palpitations have been increasingly problematic.  Some days are worse than others and this appears to be associated with more frequent PVCs i.e. coming 5 or so minute.  She is quite anxious.  Her husband is also quite anxious.  She has been losing weight with the help of [place] diet[place]    Past Medical History:  Diagnosis Date   History of chicken pox    Thyroid cyst 2004   After having her daughter but resolved   VT (ventricular tachycardia) (HCC)     Past Surgical History:  Procedure Laterality Date   ELECTROPHYSIOLOGIC STUDY N/A 09/27/2015   Procedure: PVC Ablation;  Surgeon: Hillis Range, MD;  Location: [facility];  Service: Cardiovascular;  Laterality: N/A;    Current Outpatient Medications  Medication Sig Dispense Refill   Azelastine-Fluticasone 137-50 MCG/ACT SUSP One spray in each nare twice daily. 23 g 3   busPIRone (BUSPAR) 5 MG tablet Take 5 mg by mouth 2 (two) times daily.     cholecalciferol (VITAMIN D)  1000 UNITS tablet Take 1,000 Units by mouth daily.     flecainide (TAMBOCOR) 100 MG tablet Take 1 - (100mg ) tablet by mouth every morning and 1.5 - (150mg ) tablets by mouth every night. 225 tablet 3   levonorgestrel (MIRENA) 20 MCG/24HR IUD 1 each by Intrauterine route once. Implanted April 2018     valACYclovir (VALTREX) 1000 MG tablet Take 1 tablet by mouth 2 (two) times daily as needed.     No current facility-administered medications for this visit.    No Known Allergies  Review of Systems negative except from HPI and PMH  Physical Exam BP 114/64    Pulse 87    Ht 5\' 9"  (1.753 m)    Wt 170 lb (77.1 kg)    SpO2 97%    BMI 25.10 kg/m  Well developed and nourished in no acute distress HENT normal Neck supple with JVP-  flat  Clear Regular rate and rhythm, no murmurs or gallops Abd-soft with active BS No Clubbing cyanosis edema Skin-warm and dry A & Oriented  Grossly normal sensory and motor function  ECG sinus at 76 Interval 16/09/42   Assessment and Plan  Ventricular tachycardia - repetitive monomorphic RVOT  Abnormal ECG     ECG with just minimal ST segment flattening.  Event recorder demonstrated symptoms with PVCs, and amazingly triggered within 2 seconds  or so.  More significant symptoms are associated with more frequent PVCs.  The total burden however is relatively low at less than 1%.  Hence, catheter ablation (repeat attempt) is not indicated.  Her symptoms are worse at night with recumbency.  We will have her increase her flecainide dose from 100 twice daily--100/150  Have also recommended she consider biofeedback and have given her the contact information for integrative therapies  Could consider low-dose adjunctive magnesium and/or beta/calcium blocker if symptoms persist

## 2020-07-25 NOTE — Addendum Note (Signed)
Addended by: Varney Biles on: 07/25/2020 08:39 AM   Modules accepted: Orders

## 2020-07-25 NOTE — Addendum Note (Signed)
Addended by: Jabbar Palmero M on: 07/25/2020 08:39 AM   Modules accepted: Orders  

## 2020-07-26 ENCOUNTER — Ambulatory Visit (INDEPENDENT_AMBULATORY_CARE_PROVIDER_SITE_OTHER): Payer: BC Managed Care – PPO | Admitting: Internal Medicine

## 2020-07-26 ENCOUNTER — Encounter: Payer: Self-pay | Admitting: Internal Medicine

## 2020-07-26 DIAGNOSIS — I472 Ventricular tachycardia, unspecified: Secondary | ICD-10-CM

## 2020-07-26 DIAGNOSIS — R002 Palpitations: Secondary | ICD-10-CM | POA: Diagnosis not present

## 2020-07-26 MED ORDER — FLECAINIDE ACETATE 100 MG PO TABS: ORAL_TABLET | ORAL | 3 refills | Status: DC

## 2020-07-26 NOTE — Patient Instructions (Addendum)
Medication Instructions:   Your physician has recommended you make the following change in your medication:   ** Begin taking Flecainide 100mg  - 1 tablet every morning and 150mg  - 1.5 tablets every evening.  *If you need a refill on your cardiac medications before your next appointment, please call your pharmacy*   Lab Work: None ordered.  If you have labs (blood work) drawn today and your tests are completely normal, you will receive your results only by: MyChart Message (if you have MyChart) OR . A paper copy in the mail If you have any lab test that is abnormal or we need to change your treatment, we will call you to review the results.   Testing/Procedures: None ordered.    Follow-Up: At [facility], you and your health needs are our priority.  As part of our continuing mission to provide you with exceptional heart care, we have created designated Provider Care Teams.  These Care Teams include your primary Cardiologist (physician) and Advanced Practice Providers (APPs -  Physician Assistants and Nurse Practitioners) who all work together to provide you with the care you need, when you need it.  We recommend signing up for the patient portal called "MyChart".  Sign up information is provided on this After Visit Summary.  MyChart is used to connect with patients for Virtual Visits (Telemedicine).  Patients are able to view lab/test results, encounter notes, upcoming appointments, etc.  Non-urgent messages can be sent to your provider as well.   To learn more about what you can do with MyChart, go to [facility].    Your next appointment:   3 month(s)  The format for your next appointment:   In Person  Provider:   [facility], PA-C   Other Instructions Integrative Therapies as discussed by Dr [email or web address] - phone number provided for pt.

## 2020-07-27 ENCOUNTER — Other Ambulatory Visit: Payer: Self-pay | Admitting: Family Medicine

## 2020-07-27 MED ORDER — VALACYCLOVIR HCL 1 G PO TABS: 500.0000 mg | ORAL_TABLET | Freq: Two times a day (BID) | ORAL | 2 refills | Status: DC

## 2020-07-27 NOTE — Telephone Encounter (Signed)
Refill request for pending Rx. Last OV 07/11/20 please advise.

## 2020-07-27 NOTE — Telephone Encounter (Signed)
Patient is calling and requesting a call back for Valacyclovir sent to [facility] on [address] in [place], please advise. CB is [phone]

## 2020-08-05 ENCOUNTER — Ambulatory Visit
Admission: RE | Admit: 2020-08-05 | Discharge: 2020-08-05 | Disposition: A | Discharge status: Home / Self Care | Payer: BC Managed Care – PPO | Source: Ambulatory Visit | Attending: Endocrinology | Admitting: Endocrinology

## 2020-08-05 ENCOUNTER — Ambulatory Visit
Admission: RE | Admit: 2020-08-05 | Discharge: 2020-08-05 | Disposition: A | Discharge status: Home / Self Care | Payer: BC Managed Care – PPO | Source: Ambulatory Visit | Attending: Family Medicine | Admitting: Family Medicine

## 2020-08-05 DIAGNOSIS — K7689 Other specified diseases of liver: Secondary | ICD-10-CM | POA: Diagnosis not present

## 2020-08-05 DIAGNOSIS — K824 Cholesterolosis of gallbladder: Secondary | ICD-10-CM

## 2020-08-05 DIAGNOSIS — E049 Nontoxic goiter, unspecified: Secondary | ICD-10-CM | POA: Diagnosis not present

## 2020-08-26 DIAGNOSIS — Z1152 Encounter for screening for COVID-19: Secondary | ICD-10-CM | POA: Diagnosis not present

## 2020-08-26 DIAGNOSIS — R509 Fever, unspecified: Secondary | ICD-10-CM | POA: Diagnosis not present

## 2020-08-30 ENCOUNTER — Other Ambulatory Visit: Payer: Self-pay | Admitting: Internal Medicine

## 2020-08-31 MED ORDER — FLECAINIDE ACETATE 100 MG PO TABS: ORAL_TABLET | ORAL | 3 refills | Status: DC

## 2020-09-01 ENCOUNTER — Telehealth: Payer: Self-pay | Admitting: Family Medicine

## 2020-09-01 ENCOUNTER — Telehealth: Payer: Self-pay | Admitting: Physician Assistant

## 2020-09-01 ENCOUNTER — Telehealth: Payer: Self-pay | Admitting: Internal Medicine

## 2020-09-01 DIAGNOSIS — U071 COVID-19: Secondary | ICD-10-CM

## 2020-09-01 NOTE — Telephone Encounter (Signed)
Spoke with pt and advised per our PharmD if you have developed productive cough and are Covid positive you should contact your PCP for additional assessment.  It is okay to take Coricidin HBP but avoid any products with pseudoephedrine.    Pt verbalizes understanding and agrees with current plan.  This message has also been sent through MyChart.

## 2020-09-01 NOTE — Telephone Encounter (Signed)
If patient developed productive cough and is COVID positive, should contact PCP for additional assessment.   Okay to take Coricidin HBP , but avoid any products with pseudoephedrine.

## 2020-09-01 NOTE — Telephone Encounter (Signed)
Spoke with pt who states she has confirmed Covid by Urgent Care 1 week ago.  Pt reports she has not notified her PCP of diagnosis.  Pt is calling today asking if Dr Graciela Husbands can prescribe or advise her on medication for productive cough of clear mucous d/t pt taking Flecainide.  Pt reports coughing has increased over the last few days.  Pt advised Dr Graciela Husbands has been out of the office and recommend pt contact PCP for further advisement and possible referral for monoclonal antibody infusion.  Will forward information to PharmD for cough med recommendation.  Pt verbalizes understanding and states she will contact her PCP.

## 2020-09-01 NOTE — Telephone Encounter (Signed)
Called to discuss with Renaldo Reel about Covid symptoms and the use of  monoclonal antibody infusion for those with mild to moderate Covid symptoms and at a high risk of hospitalization.     Pt is qualified for this infusion due to co-morbid conditions and/or a member of an at-risk group, however declines infusion at this time.  Symptoms reviewed as well as criteria for ending isolation.  Symptoms reviewed that would warrant ED/Hospital evaluation. Preventative practices reviewed. Patient verbalized understanding. Patient advised to call back if he decides that he does want to get infusion. Callback number to the infusion center given. Patient advised to go to Urgent care or ED with severe symptoms. Last date she would be eligible for infusion is 09/03/20. Risk factors: heart disease and unvaccinated.       Patient Active Problem List   Diagnosis Date Noted  . V-tach (HCC) 07/25/2020  . Palpitations 07/25/2020  . Seasonal allergic rhinitis due to pollen 07/11/2020  . 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 Rash 06/08/2019  . Well woman exam without gynecological exam 06/02/2018  . History of vitamin D deficiency 06/02/2018  . History of thyroid nodule 06/02/2018  . Cold sore 08/27/2017  . Gallbladder polyp 07/26/2015  . Panic attacks 07/26/2015  . Thyroid nodule 07/26/2015  . 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 Tachycardia 01/02/2013    Manson Passey, PA-C

## 2020-09-01 NOTE — Telephone Encounter (Signed)
Patient is calling to let the office know that she tested positive for COVID. She wants to know if something could be prescribed for her cough. She said that Urgent Care and her Cardiologist told her to contact her PCP. Please call her back at [phone] and advise.

## 2020-09-01 NOTE — Telephone Encounter (Signed)
Heather Rice is calling stating she currently has Covid and is wanting to know if there is something Dr. Graciela Husbands can prescribe that is like Mucinex for her to take due to Dr. Graciela Husbands advising her not to take cold medicine. Please advise.

## 2020-09-02 ENCOUNTER — Other Ambulatory Visit: Payer: Self-pay | Admitting: Physician Assistant

## 2020-09-02 ENCOUNTER — Ambulatory Visit (HOSPITAL_COMMUNITY)
Admission: RE | Admit: 2020-09-02 | Discharge: 2020-09-02 | Disposition: A | Discharge status: Home / Self Care | Payer: BC Managed Care – PPO | Source: Ambulatory Visit | Attending: Pulmonary Disease | Admitting: Pulmonary Disease

## 2020-09-02 DIAGNOSIS — U071 COVID-19: Secondary | ICD-10-CM | POA: Insufficient documentation

## 2020-09-02 DIAGNOSIS — I10 Essential (primary) hypertension: Secondary | ICD-10-CM | POA: Insufficient documentation

## 2020-09-02 DIAGNOSIS — I472 Ventricular tachycardia, unspecified: Secondary | ICD-10-CM

## 2020-09-02 MED ORDER — FAMOTIDINE IN NACL 20-0.9 MG/50ML-% IV SOLN: 20.0000 mg | Freq: Once | INTRAVENOUS | Status: DC | PRN

## 2020-09-02 MED ORDER — EPINEPHRINE 0.3 MG/0.3ML IJ SOAJ: 0.3000 mg | Freq: Once | INTRAMUSCULAR | Status: DC | PRN

## 2020-09-02 MED ORDER — DIPHENHYDRAMINE HCL 50 MG/ML IJ SOLN: 50.0000 mg | Freq: Once | INTRAMUSCULAR | Status: DC | PRN

## 2020-09-02 MED ORDER — BENZONATATE 100 MG PO CAPS: 100.0000 mg | ORAL_CAPSULE | Freq: Three times a day (TID) | ORAL | 0 refills | Status: DC | PRN

## 2020-09-02 MED ORDER — ALBUTEROL SULFATE HFA 108 (90 BASE) MCG/ACT IN AERS: 2.0000 | INHALATION_SPRAY | Freq: Once | RESPIRATORY_TRACT | Status: DC | PRN

## 2020-09-02 MED ORDER — METHYLPREDNISOLONE SODIUM SUCC 125 MG IJ SOLR: 125.0000 mg | Freq: Once | INTRAMUSCULAR | Status: DC | PRN

## 2020-09-02 MED ORDER — SODIUM CHLORIDE 0.9 % IV SOLN: INTRAVENOUS | Status: DC | PRN

## 2020-09-02 MED ORDER — SOTROVIMAB 500 MG/8ML IV SOLN
500.0000 mg | Freq: Once | INTRAVENOUS | Status: AC
  Administered 2020-09-02: 500 mg via INTRAVENOUS

## 2020-09-02 NOTE — Telephone Encounter (Signed)
Rx for tessalon sent to pharm. If these are not effective or pt notes wheezing, SOB, etc then she needs to f/u

## 2020-09-02 NOTE — Discharge Instructions (Signed)

## 2020-09-02 NOTE — Progress Notes (Signed)
I connected by phone with Heather Rice on 09/02/2020 at 12:19 PM to discuss the potential use of a new treatment for mild to moderate COVID-19 viral infection in non-hospitalized patients.  This patient is a 44 y.o. female that meets the FDA criteria for Emergency Use Authorization of COVID monoclonal antibody casirivimab/imdevimab, bamlanivimab/eteseviamb, or sotrovimab.  Has a (+) direct SARS-CoV-2 viral test result  Has mild or moderate COVID-19   Is NOT hospitalized due to COVID-19  Is within 10 days of symptom onset  Has at least one of the high risk factor(s) for progression to severe COVID-19 and/or hospitalization as defined in EUA.  Specific high risk criteria : Cardiovascular disease or hypertension and Other high risk medical condition per CDC:  unvaccinated for COVID   I have spoken and communicated the following to the patient or parent/caregiver regarding COVID monoclonal antibody treatment:  1. FDA has authorized the emergency use for the treatment of mild to moderate COVID-19 in adults and pediatric patients with positive results of direct SARS-CoV-2 viral testing who are 70 years of age and older weighing at least 40 kg, and who are at high risk for progressing to severe COVID-19 and/or hospitalization.  2. The significant known and potential risks and benefits of COVID monoclonal antibody, and the extent to which such potential risks and benefits are unknown.  3. Information on available alternative treatments and the risks and benefits of those alternatives, including clinical trials.  4. Patients treated with COVID monoclonal antibody should continue to self-isolate and use infection control measures (e.g., wear mask, isolate, social distance, avoid sharing personal items, clean and disinfect "high touch" surfaces, and frequent handwashing) according to CDC guidelines.   5. The patient or parent/caregiver has the option to accept or refuse COVID monoclonal antibody  treatment.  After reviewing this information with the patient, the patient has agreed to receive one of the available covid 19 monoclonal antibodies and will be provided an appropriate fact sheet prior to infusion. [place], [place] 09/02/2020 12:19 PM

## 2020-09-02 NOTE — Telephone Encounter (Signed)
Please advise on message below. Thanks. Dm/cma  

## 2020-09-02 NOTE — Telephone Encounter (Signed)
Patient notified VIA phone and is agreeable to recommendations. No questions.   Dm/cma

## 2020-09-02 NOTE — Progress Notes (Signed)
Diagnosis: COVID-19  Physician: Dr. Patrick Wright  Procedure: Covid Infusion Clinic Med: Sotrovimab infusion - Provided patient with sotrovimab fact sheet for patients, parents, and caregivers prior to infusion.   Complications: No immediate complications noted  Discharge: Discharged home  If after the infusion you have any questions or concerns please call the Advanced Practice Provider at [phone] 

## 2020-09-02 NOTE — Progress Notes (Signed)
Patient reviewed Fact Sheet for Patients, Parents, and Caregivers for Emergency Use Authorization (EUA) of Sotrovimab for the Treatment of Coronavirus. Patient also reviewed and is agreeable to the estimated cost of treatment. Patient is agreeable to proceed.    Dorin Stooksbury R Livianna Petraglia, RN  

## 2020-09-02 NOTE — Addendum Note (Signed)
Addended by: Overton Mam on: 09/02/2020 11:35 AM   Modules accepted: Orders

## 2020-09-04 DIAGNOSIS — U071 COVID-19: Secondary | ICD-10-CM | POA: Diagnosis not present

## 2020-09-04 DIAGNOSIS — R059 Cough, unspecified: Secondary | ICD-10-CM | POA: Diagnosis not present

## 2020-09-12 DIAGNOSIS — Z1231 Encounter for screening mammogram for malignant neoplasm of breast: Secondary | ICD-10-CM | POA: Diagnosis not present

## 2020-09-12 DIAGNOSIS — Z01419 Encounter for gynecological examination (general) (routine) without abnormal findings: Secondary | ICD-10-CM | POA: Diagnosis not present

## 2020-09-12 DIAGNOSIS — Z6824 Body mass index (BMI) 24.0-24.9, adult: Secondary | ICD-10-CM | POA: Diagnosis not present

## 2020-10-28 ENCOUNTER — Ambulatory Visit: Payer: BC Managed Care – PPO | Admitting: Physician Assistant

## 2020-11-06 NOTE — Progress Notes (Signed)
Cardiology Office Note Date:  11/06/2020  Patient ID:  Heather Rice, Heather Rice 1975/10/24, MRN [id number] PCP:  Mliss Sax, MD  Electrophysiologist: Dr. Graciela Husbands   Chief Complaint:  planned f/u  History of Present Illness: Heather Rice is a 45 y.o. female with history of VT, NSVT, PVC's, treated with BB and Flecainide, she was referred to Dr. Johney Frame and underwent EPS without inducible VT, subsequently at her last office visit, a f/u with Dr. Johney Frame, (Jan 2017) in discussion of treatment options (weaning Flecainide and consider ablation if recurrent VT or continuing meds and watchful waiting), watchful waiting and ongoing medical therapy with Flecainide was decided.  I saw her in March 2018 for Dr. Graciela Husbands for a routine, planned annual visit, she is feeling well, no c/o palpitations, no dizziness, near syncope or syncope, no CP or SOB.  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  Mentioned she did not exercise being worried about provoking a tachycardia.  She mentioned an uncle with PVD and wanted her LE and cholesterol checked.  Her EKG noted stable intervals, she had no exam findings to suggest any PVD, recommended to avoid long periods of sitting and initiate walking for exercise.  Lipds looked OK.  I saw her Aug 2019 She ws doing well.  She mentioned again feeling like her legs are heavy after prolonged periods of sitting. 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  No pain, no swelling, no skin changes.  She had not had any palpitations, no CP, no SOB.  2 weeksprior while standing at work she had a split second of sudden dizziness.  None since.  She mentioned on a couple of occassions upon standing she felt transiently lightheaded, this different that momentary spell.  Also stated that she had  found infrequently her BP 90's systolic and generally feeling tired.  She was being evaluated for thyroid cysts, had labs done that AM.   No syncope.  She does not exercise.  She does not describe symptoms of claudication. She was recommended to increase hydration and undergo LE venou [place] and ABI for completeness.  She wanted to hold off.   Most recently saw Dr. Graciela Husbands Aug 2020, she was doing well, no changes were made.   I saw her 02/22/2020 She feels well over all.  She walks though nothing more strenuous given when she VT previously she was exercising.  She denies any CP, palpitations, SOB or DOE.  No near syncope or syncope. She is worried about the circulation in her legs.  Mentions an uncle hs severe PAD and had to have an amputations. She was at her daughter's [place] Ouch party a few weeks back, and as night came and became cool >cold out the aching in her legs became much worse.  She at some times feels like walking helps the aching sensation, but also when walks stairs for example feels like her calves hurt, painful. She has also noted R foot very lateral edge will feel like pins/needles when walking (this is rare) We pursued LE vascular studies No DVTs b/l, ABI and arterial studies were normal.   She called 06/21/20 with about 3 weeks of palpitations  I saw her 06/22/20 About 3 weeks ago she started noticing palpitations.  This seems to correlate  with an acute up-tick in work stress, though not only feeling them at work. They can happen at rest, in the evenings or at work.  Sometimes she wakes feeling like she has been running a race all night. No CP She walks for exercise nothing more vigorous then that. She describes the palpitations as brief lasting a few seconds though can be on/off for 3 minutes sometiems. They do not seem positional or exertionally triggered. When she has them they seem to radiate/feels them in her throat as well, and reflexively takes a quick deep breath in. They do  not make her feel lightheaded or near syncopal.  She has not had syncope They are slightly reminiscent of her VT though she says 20% of the severity that her VT felt like, and without the "black sensation in her vision" that she had with the VT. Some time ago she had mentioned to her GYN that she felt a bit depressed/down about the time of her period and was Rx Buspar but never started it.  Since this seemed to be associated with increased work stress she went ahead and started the buspar about 10 days ago. She had her menses 1 week ago and is certain she is not pregnant. She has not had an awareness of her palpitations in the last 5 days.  Notes by her watch her HR sometimes in the 40's with no symptoms, and sometimes 140's also without symptoms. No noted observation of her HR with her palpitations Planned for ZIo monitoring and Dr. Graciela Husbands f/u  Her monitor was reviewed with Dr. Graciela Husbands.  Symptom strips correlate with PVCs PVC burden <1% Recommends no changes, and reassurance of the benign nature of her palpitations.  Also had an echo with normal bive function, no significant VHD  She saw Dr. Graciela Husbands Oct 2021, flecainide was increased from 100mg  BID to 100mg  AM and 150mg  PM when her most notable symptoms are at night.  She had COVID Nov 2021, treated w/MCA.  TODAY In the last couple months her palpitations have significantly improved and her current medical regime seems to be working quite well. No CP, no SOB, no exertional intolerances. She has had very fleeting dizzy spell here/there, though is less then a second, nothing more No near syncope or syncope.   Past Medical History:  Diagnosis Date  . History of chicken pox   . Thyroid cyst 2004   After having her daughter but resolved  . VT (ventricular tachycardia) (HCC)     Past Surgical History:  Procedure Laterality Date  . ELECTROPHYSIOLOGIC STUDY N/A 09/27/2015   Procedure: PVC Ablation;  Surgeon: 06-30-1993, MD;  Location: [facility]  INVASIVE CV LAB;  Service: Cardiovascular;  Laterality: N/A;    Current Outpatient Medications  Medication Sig Dispense Refill  . Azelastine-Fluticasone 137-50 MCG/ACT SUSP One spray in each nare twice daily. 23 g 3  . benzonatate (TESSALON) 100 MG capsule Take 1 capsule (100 mg total) by mouth 3 (three) times daily as needed for cough. 30 capsule 0  . busPIRone (BUSPAR) 5 MG tablet Take 5 mg by mouth 2 (two) times daily.    . cholecalciferol (VITAMIN D) 1000 UNITS tablet Take 1,000 Units by mouth daily.    . flecainide (TAMBOCOR) 100 MG tablet Take 1 - (100mg ) tablet by mouth every morning and 1.5 - (150mg ) tablets by mouth every night. 225 tablet 3  . levonorgestrel (MIRENA) 20 MCG/24HR IUD 1 each by Intrauterine route once. Implanted April 2018  No current facility-administered medications for this visit.    Allergies:   Patient has no known allergies.   Social History:  The patient  reports that she has never smoked. She has never used smokeless tobacco. She reports that she does not drink alcohol and does not use drugs.   Family History:  The patient's family history includes Arthritis in her mother; Birth defects in her maternal grandfather; Cancer in her paternal grandfather; Diabetes in her maternal grandmother, mother, and paternal grandmother; Early death in her father.  ROS:  Please see the history of present illness.  All other systems are reviewed and otherwise negative.   PHYSICAL EXAM:  VS:  There were no vitals taken for this visit. BMI: There is no height or weight on file to calculate BMI. Well nourished, well developed, in no acute distress  HEENT: normocephalic, atraumatic  Neck: no JVD, carotid bruits or masses Cardiac:   RRR; no significant murmurs, no rubs, or gallops Lungs:  CTA b/l, no wheezing, rhonchi or rales  Abd: soft, nontender MS: no deformity or atrophy Ext:  no edema Skin: warm and dry, no rash Neuro:  No gross deficits appreciated Psych:  euthymic mood, full affect    EKG:  Done today and reviewed by myself:  SR 63bpm, PR , QRS  , nonspecific T changes similar to others 06/22/20:   SR 65bpm, PR , QRS 19ms, QTc 416, unchanged from prior 02/22/20: SR 63bpm, PR , QRS 36ms, QTc , nonspecific ST/T changes, appears unchanged 06/02/18: SR 66bpm, PR , QRS 103ms, QTc , nonspecific T changes, unchanged from prior 12/28/16: SB, 58bpm, PR , QRS 70ms, QTc , nonspecific T changes look similar to previous   06/29/2020: TTE IMPRESSIONS  1. Left ventricular ejection fraction, by estimation, is 60 to 65%. The  left ventricle has normal function. The left ventricle has no regional  wall motion abnormalities. Left ventricular diastolic parameters were  normal.  2. Right ventricular systolic function is normal. The right ventricular  size is normal.  3. The mitral valve is grossly normal. No evidence of mitral valve  regurgitation.  4. The aortic valve was not well visualized. Aortic valve regurgitation  is not visualized.  5. The inferior vena cava is normal in size with greater than 50%  respiratory variability, suggesting right atrial pressure of 3 mmHg.   Conclusion(s)/Recommendation(s): Normal biventricular function without  evidence of hemodynamically significant valvular heart disease.    Oct 2021, monitor Findings HR  avg 71  Min 47-Max 140  PVCs Rare, less than 1%   PACs Rare, less than 1%   SVT Nonsustained 1 episodes   Multiple triggered events assoc with isolated PVC    03/30/14: TTE LVEF 60-65% No significant valve disease  02/17/13: Stress myoview Normal myocardial perfusion study   Recent Labs: 06/22/2020: Hemoglobin 15.3; Magnesium 2.0; Platelets 190; TSH 3.340 07/25/2020: ALT 9; BUN 11; Creatinine, Ser 0.76; Potassium 4.0; Sodium 137  07/25/2020: Cholesterol 197; HDL 68.80; LDL Cholesterol 108; Total CHOL/HDL Ratio 3; Triglycerides 100.0; VLDL 20.0   CrCl  cannot be calculated (Patient's most recent lab result is older than the maximum 21 days allowed.).   Wt Readings from Last 3 Encounters:  07/26/20 170 lb (77.1 kg)  07/11/20 167 lb 14.4 oz (76.2 kg)  06/22/20 167 lb (75.8 kg)     Other studies reviewed: Additional studies/records reviewed today include: summarized above  ASSESSMENT AND PLAN:  1. VT     on Flecainide, stable intervals  Record indicates intolerant of CCB, BB, she recalls feeling awful with metoprolol     Palpitations are much improved    Disposition: will see her back in 9mo, sooner if needed  Current medicines are reviewed at length with the patient today.  The patient did not have any concerns regarding medicines.  Judith Blonder, PA-C 11/06/2020 10:20 AM     [facility] [address] [place] [address] [phone] (office)  [phone] (fax)

## 2020-11-09 ENCOUNTER — Other Ambulatory Visit: Payer: Self-pay

## 2020-11-09 ENCOUNTER — Ambulatory Visit (INDEPENDENT_AMBULATORY_CARE_PROVIDER_SITE_OTHER): Payer: BC Managed Care – PPO | Admitting: Physician Assistant

## 2020-11-09 ENCOUNTER — Encounter: Payer: Self-pay | Admitting: Physician Assistant

## 2020-11-09 VITALS — BP 106/70 | HR 63 | Ht 69.0 in | Wt 173.2 lb

## 2020-11-09 DIAGNOSIS — Z5181 Encounter for therapeutic drug level monitoring: Secondary | ICD-10-CM | POA: Diagnosis not present

## 2020-11-09 DIAGNOSIS — I472 Ventricular tachycardia, unspecified: Secondary | ICD-10-CM

## 2020-11-09 DIAGNOSIS — Z79899 Other long term (current) drug therapy: Secondary | ICD-10-CM | POA: Diagnosis not present

## 2020-11-09 NOTE — Patient Instructions (Signed)
Medication Instructions:    Your physician recommends that you continue on your current medications as directed. Please refer to the Current Medication list given to you today.  *If you need a refill on your cardiac medications before your next appointment, please call your pharmacy*   Lab Work: NONE ORDERED  TODAY   If you have labs (blood work) drawn today and your tests are completely normal, you will receive your results only by: MyChart Message (if you have MyChart) OR A paper copy in the mail If you have any lab test that is abnormal or we need to change your treatment, we will call you to review the results.   Testing/Procedures: NONE ORDERED  TODAY    Follow-Up: At [facility], you and your health needs are our priority.  As part of our continuing mission to provide you with exceptional heart care, we have created designated Provider Care Teams.  These Care Teams include your primary Cardiologist (physician) and Advanced Practice Providers (APPs -  Physician Assistants and Nurse Practitioners) who all work together to provide you with the care you need, when you need it.  We recommend signing up for the patient portal called "MyChart".  Sign up information is provided on this After Visit Summary.  MyChart is used to connect with patients for Virtual Visits (Telemedicine).  Patients are able to view lab/test results, encounter notes, upcoming appointments, etc.  Non-urgent messages can be sent to your provider as well.   To learn more about what you can do with MyChart, go to [email or web address].    Your next appointment:   6 month(s)  The format for your next appointment:   In Person  Provider:   Steven Klein, MD   Other Instructions  

## 2021-05-09 ENCOUNTER — Ambulatory Visit: Payer: BC Managed Care – PPO | Admitting: Internal Medicine

## 2021-07-12 ENCOUNTER — Encounter: Payer: Self-pay | Admitting: Family Medicine

## 2021-07-12 ENCOUNTER — Other Ambulatory Visit: Payer: Self-pay

## 2021-07-12 ENCOUNTER — Ambulatory Visit (INDEPENDENT_AMBULATORY_CARE_PROVIDER_SITE_OTHER): Payer: BC Managed Care – PPO | Admitting: Family Medicine

## 2021-07-12 VITALS — BP 115/68 | HR 64 | Temp 97.4°F | Ht 69.0 in | Wt 174.0 lb

## 2021-07-12 DIAGNOSIS — Z0000 Encounter for general adult medical examination without abnormal findings: Secondary | ICD-10-CM | POA: Diagnosis not present

## 2021-07-12 LAB — COMPREHENSIVE METABOLIC PANEL
ALT: 9 U/L (ref 0–35)
AST: 12 U/L (ref 0–37)
Albumin: 4.6 g/dL (ref 3.5–5.2)
Alkaline Phosphatase: 57 U/L (ref 39–117)
BUN: 12 mg/dL (ref 6–23)
CO2: 30 mEq/L (ref 19–32)
Calcium: 9.8 mg/dL (ref 8.4–10.5)
Chloride: 101 mEq/L (ref 96–112)
Creatinine, Ser: 0.78 mg/dL (ref 0.40–1.20)
GFR: 93.18 mL/min (ref >60.00)
Glucose, Bld: 83 mg/dL (ref 70–99)
Potassium: 4 mEq/L (ref 3.5–5.1)
Sodium: 139 mEq/L (ref 135–145)
Total Bilirubin: 0.9 mg/dL (ref 0.2–1.2)
Total Protein: 7.4 g/dL (ref 6.0–8.3)

## 2021-07-12 LAB — URINALYSIS, ROUTINE W REFLEX MICROSCOPIC
Bilirubin Urine: NEGATIVE
Hgb urine dipstick: NEGATIVE
Ketones, ur: NEGATIVE
Leukocytes,Ua: NEGATIVE
Nitrite: NEGATIVE
RBC / HPF: NONE SEEN (ref 0–?)
Specific Gravity, Urine: 1.01 (ref 1.000–1.030)
Total Protein, Urine: NEGATIVE
Urine Glucose: NEGATIVE
Urobilinogen, UA: 0.2 (ref 0.0–1.0)
WBC, UA: NONE SEEN (ref 0–?)
pH: 7.5 (ref 5.0–8.0)

## 2021-07-12 LAB — LIPID PANEL
Cholesterol: 203 mg/dL — ABNORMAL HIGH (ref 0–200)
HDL: 75.3 mg/dL (ref >39.00)
LDL Cholesterol: 116 mg/dL — ABNORMAL HIGH (ref 0–99)
NonHDL: 128.01
Total CHOL/HDL Ratio: 3
Triglycerides: 61 mg/dL (ref 0.0–149.0)
VLDL: 12.2 mg/dL (ref 0.0–40.0)

## 2021-07-12 LAB — CBC
HCT: 43.5 % (ref 36.0–46.0)
Hemoglobin: 14.7 g/dL (ref 12.0–15.0)
MCHC: 33.9 g/dL (ref 30.0–36.0)
MCV: 93.5 fl (ref 78.0–100.0)
Platelets: 178 10*3/uL (ref 150.0–400.0)
RBC: 4.65 Mil/uL (ref 3.87–5.11)
RDW: 12.8 % (ref 11.5–15.5)
WBC: 6.6 10*3/uL (ref 4.0–10.5)

## 2021-07-12 NOTE — Progress Notes (Signed)
Established Patient Office Visit  Subjective:  Patient ID: Heather Rice, female    DOB: 1976/04/06  Age: 45 y.o. MRN: [id number]  CC:  Chief Complaint  Patient presents with   Annual Exam    CPE, no concerns. Patient fasting for blood work.     HPI Heather Rice presents for her yearly health check.  She has been doing well.  Has had no problems or issues.  She is here with her mother.  She would like for me to check her urine because she seems to have developed a strong onion smell after recovering from COVID recently.  She is otherwise well.  Denies dysuria frequency or emergency.  Past Medical History:  Diagnosis Date   History of chicken pox    Thyroid cyst 2004   After having her daughter but resolved   VT (ventricular tachycardia) (HCC)     Past Surgical History:  Procedure Laterality Date   ELECTROPHYSIOLOGIC STUDY N/A 09/27/2015   Procedure: PVC Ablation;  Surgeon: Hillis Range, MD;  Location: [facility];  Service: Cardiovascular;  Laterality: N/A;    Family History  Problem Relation Age of Onset   Diabetes Mother    Arthritis Mother    Early death Father        MVA   Diabetes Maternal Grandmother    Birth defects Maternal Grandfather    Diabetes Paternal Grandmother    Cancer Paternal Grandfather        throat    Social History   Socioeconomic History   Marital status: Married    Spouse name: Not on file   Number of children: Not on file   Years of education: Not on file   Highest education level: Not on file  Occupational History   Not on file  Tobacco Use   Smoking status: Never   Smokeless tobacco: Never  Vaping Use   Vaping Use: Never used  Substance and Sexual Activity   Alcohol use: No   Drug use: No   Sexual activity: Yes    Birth control/protection: I.U.D.  Other Topics Concern   Not on file  Social History Narrative   Not on file   Social Determinants of Health   Financial Resource Strain: Not on file  Food  Insecurity: Not on file  Transportation Needs: Not on file  Physical Activity: Not on file  Stress: Not on file  Social Connections: Not on file  Intimate Partner Violence: Not on file    Outpatient Medications Prior to Visit  Medication Sig Dispense Refill   Azelastine-Fluticasone 137-50 MCG/ACT SUSP One spray in each nare twice daily. 23 g 3   busPIRone (BUSPAR) 5 MG tablet Take 2.5 mg by mouth 2 (two) times daily.     cholecalciferol (VITAMIN D) 1000 UNITS tablet Take 1,000 Units by mouth daily.     flecainide (TAMBOCOR) 100 MG tablet Take 1 - (100mg ) tablet by mouth every morning and 1.5 - (150mg ) tablets by mouth every night. 225 tablet 3   levonorgestrel (MIRENA) 20 MCG/24HR IUD 1 each by Intrauterine route once. Implanted April 2018     valACYclovir (VALTREX) 500 MG tablet Take 1 tablet by mouth as needed.     No facility-administered medications prior to visit.    No Known Allergies  ROS Review of Systems  Constitutional: Negative.   HENT: Negative.    Eyes:  Negative for photophobia and visual disturbance.  Respiratory: Negative.    Cardiovascular: Negative.   Gastrointestinal: Negative.  Endocrine: Negative for polyphagia and polyuria.  Genitourinary:  Negative for difficulty urinating, dysuria, frequency and urgency.  Musculoskeletal: Negative.   Skin: Negative.   Neurological:  Negative for tremors and weakness.  Psychiatric/Behavioral: Negative.       Depression screen [facility] 2/9 07/12/2021 07/12/2021 06/08/2019  Decreased Interest 0 0 0  Down, Depressed, Hopeless 0 0 0  PHQ - 2 Score 0 0 0  Altered sleeping 0 - -  Tired, decreased energy 0 - -  Change in appetite 0 - -  Feeling bad or failure about yourself  0 - -  Trouble concentrating 0 - -  Moving slowly or fidgety/restless 0 - -  Suicidal thoughts 0 - -  PHQ-9 Score 0 - -  Difficult doing work/chores Not difficult at all - -     Objective:    Physical Exam Vitals and nursing note reviewed.   Constitutional:      General: She is not in acute distress.    Appearance: Normal appearance. She is normal weight. She is not ill-appearing, toxic-appearing or diaphoretic.  HENT:     Head: Normocephalic and atraumatic.     Right Ear: Tympanic membrane, ear canal and external ear normal.     Left Ear: Tympanic membrane, ear canal and external ear normal.     Mouth/Throat:     Mouth: Mucous membranes are moist.     Pharynx: Oropharynx is clear. No oropharyngeal exudate or posterior oropharyngeal erythema.  Eyes:     General: No scleral icterus.       Right eye: No discharge.        Left eye: No discharge.     Conjunctiva/sclera: Conjunctivae normal.     Pupils: Pupils are equal, round, and reactive to light.  Neck:     Vascular: No carotid bruit.  Cardiovascular:     Rate and Rhythm: Normal rate and regular rhythm.  Pulmonary:     Effort: Pulmonary effort is normal.     Breath sounds: Normal breath sounds.  Abdominal:     General: Abdomen is flat. Bowel sounds are normal. There is no distension.     Palpations: There is no mass.     Tenderness: There is no abdominal tenderness. There is no guarding or rebound.     Hernia: No hernia is present.  Musculoskeletal:     Cervical back: No rigidity or tenderness.     Right lower leg: No edema.     Left lower leg: No edema.  Lymphadenopathy:     Cervical: No cervical adenopathy.  Skin:    General: Skin is warm and dry.  Neurological:     Mental Status: She is alert and oriented to person, place, and time.  Psychiatric:        Mood and Affect: Mood normal.        Behavior: Behavior normal.    BP 115/68 (BP Location: Left Arm, Patient Position: Sitting, Cuff Size: Normal)   Pulse 64   Temp (!) 97.4 F (36.3 C) (Temporal)   Ht 5\' 9"  (1.753 m)   Wt 174 lb (78.9 kg)   BMI 25.70 kg/m  Wt Readings from Last 3 Encounters:  07/12/21 174 lb (78.9 kg)  11/09/20 173 lb 3.2 oz (78.6 kg)  07/26/20 170 lb (77.1 kg)     Health  Maintenance Due  Topic Date Due   Hepatitis C Screening  Never done   TETANUS/TDAP  03/04/2021   PAP SMEAR-Modifier  07/02/2021    There  are no preventive care reminders to display for this patient.  Lab Results  Component Value Date   TSH 3.340 06/22/2020   Lab Results  Component Value Date   WBC 6.9 06/22/2020   HGB 15.3 06/22/2020   HCT 44.1 06/22/2020   MCV 91 06/22/2020   PLT 190 06/22/2020   Lab Results  Component Value Date   NA 137 07/25/2020   K 4.0 07/25/2020   CO2 29 07/25/2020   GLUCOSE 87 07/25/2020   BUN 11 07/25/2020   CREATININE 0.76 07/25/2020   BILITOT 0.6 07/25/2020   ALKPHOS 52 07/25/2020   AST 10 07/25/2020   ALT 9 07/25/2020   PROT 6.9 07/25/2020   ALBUMIN 4.5 07/25/2020   CALCIUM 9.3 07/25/2020   ANIONGAP 6 04/16/2015   GFR 96.60 07/25/2020   Lab Results  Component Value Date   CHOL 197 07/25/2020   Lab Results  Component Value Date   HDL 68.80 07/25/2020   Lab Results  Component Value Date   LDLCALC 108 (H) 07/25/2020   Lab Results  Component Value Date   TRIG 100.0 07/25/2020   Lab Results  Component Value Date   CHOLHDL 3 07/25/2020   Lab Results  Component Value Date   HGBA1C 4.8 08/28/2017      Assessment & Plan:   Problem List Items Addressed This Visit   None Visit Diagnoses     Healthcare maintenance    -  Primary   Relevant Orders   CBC   Comprehensive metabolic panel   Lipid panel   Urinalysis, Routine w reflex microscopic       No orders of the defined types were placed in this encounter.   Follow-up: No follow-ups on file.  Information was given on health maintenance and disease prevention.  Checking labs today.  Female care is through GYN.  Mliss Sax, MD

## 2021-07-17 ENCOUNTER — Ambulatory Visit (INDEPENDENT_AMBULATORY_CARE_PROVIDER_SITE_OTHER): Payer: BC Managed Care – PPO | Admitting: Internal Medicine

## 2021-07-17 ENCOUNTER — Other Ambulatory Visit: Payer: Self-pay

## 2021-07-17 ENCOUNTER — Encounter: Payer: Self-pay | Admitting: Internal Medicine

## 2021-07-17 VITALS — BP 112/62 | HR 62 | Ht 69.0 in | Wt 177.4 lb

## 2021-07-17 DIAGNOSIS — I472 Ventricular tachycardia, unspecified: Secondary | ICD-10-CM | POA: Diagnosis not present

## 2021-07-17 NOTE — Progress Notes (Signed)
Patient Care Team: Mliss Sax, MD as PCP - General (Family Medicine)   HPI  Heather Rice is a 45 y.o. 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 These were short nonsustained runs Effort to treat with calcium blockers and beta blockers were insufficient and so she ended up on  flecainide and metoprolol. No longer on beta blockers  Exercise test July showed no evidence of ventricular tachycardia  ECGs have shown T-wave inversion from V4 variably back thorugh the data we reviewed to 2007  DATE TEST EF   9/16 cMRI  67 % Normal  9/21  Echo 60-65 %         SAECG 9/16 No ( 112/43/12)  DATE PR interval QRSduration Dose  8/20 174 86 100  10/21 156 92 100    Date Cr K Hgb  9/22 0.78 4.0 14.7    Event Recorder personnally reviewed  Symptoms assoc with PVCs isolated with total burden < 1%    Overall, she states that her heart beat has been good.  A few times she has noticed her blood pressure as low as 95/58, and her heart rate around 42 bpm. At those times she felt a little fatigued but was otherwise asymptomatic.  The patient denies chest pain, shortness of breath, nocturnal dyspnea, orthopnea or peripheral edema.  There have been no palpitations, lightheadedness or syncope.  Since her last visit, buspar was added and her anxiety has improved significantly.    Past Medical History:  Diagnosis Date   History of chicken pox    Thyroid cyst 2004   After having her daughter but resolved   VT (ventricular tachycardia)     Past Surgical History:  Procedure Laterality Date   ELECTROPHYSIOLOGIC STUDY N/A 09/27/2015   Procedure: PVC Ablation;  Surgeon: Hillis Range, MD;  Location: [facility];  Service: Cardiovascular;  Laterality: N/A;    Current Outpatient Medications   Medication Sig Dispense Refill   Azelastine-Fluticasone 137-50 MCG/ACT SUSP One spray in each nare twice daily. 23 g 3   busPIRone (BUSPAR) 5 MG tablet Take 2.5 mg by mouth 2 (two) times daily.     cholecalciferol (VITAMIN D) 1000 UNITS tablet Take 1,000 Units by mouth daily.     flecainide (TAMBOCOR) 100 MG tablet Take 1 - (100mg ) tablet by mouth every morning and 1.5 - (150mg ) tablets by mouth every night. 225 tablet 3   levonorgestrel (MIRENA) 20 MCG/24HR IUD 1 each by Intrauterine route once. Implanted April 2018     valACYclovir (VALTREX) 500 MG tablet Take 1 tablet by mouth as needed.     No current facility-administered medications for this visit.    No Known Allergies  Review of Systems negative except from HPI and PMH  Physical Exam BP 112/62   Pulse 62   Ht 5\' 9"  (1.753 m)   Wt 177 lb 6.4 oz (80.5 kg)   SpO2 99%   BMI 26.20 kg/m  Well developed and nourished in no acute distress HENT normal Neck supple with JVP-  flat   Clear Regular rate and rhythm, no murmurs or gallops Abd-soft with active BS No Clubbing cyanosis edema Skin-warm and dry A & Oriented  Grossly normal sensory and motor function  ECG sinus at 62 Intervals 19/09/46    Assessment and Plan  Ventricular tachycardia -  repetitive monomorphic RVOT  Abnormal ECG    ECG with just minimal ST segment flattening.  Overall doing exceptionally well.  Occasional palpitations.  Tolerating flecainide and adjunctive BuSpar has taken the edge off of the stresses.  Continue flecainide 100/150    I,Mathew Stumpf,acting as a scribe for Sherryl Manges, MD.,have documented all relevant documentation on the behalf of Sherryl Manges, MD,as directed by  Sherryl Manges, MD while in the presence of Sherryl Manges, MD.  I, Sherryl Manges, MD, have reviewed all documentation for this visit. The documentation on 07/17/21 for the exam, diagnosis, procedures, and orders are all accurate and complete.

## 2021-07-17 NOTE — Patient Instructions (Signed)

## 2021-08-01 ENCOUNTER — Other Ambulatory Visit: Payer: Self-pay | Admitting: Family Medicine

## 2021-08-01 DIAGNOSIS — J301 Allergic rhinitis due to pollen: Secondary | ICD-10-CM

## 2021-08-14 ENCOUNTER — Other Ambulatory Visit: Payer: Self-pay | Admitting: Family Medicine

## 2021-09-15 ENCOUNTER — Encounter: Payer: Self-pay | Admitting: Family Medicine

## 2021-09-15 ENCOUNTER — Telehealth: Payer: Self-pay

## 2021-09-15 DIAGNOSIS — R21 Rash and other nonspecific skin eruption: Secondary | ICD-10-CM

## 2021-09-15 MED ORDER — ADAPALENE-BENZOYL PEROXIDE 0.3-2.5 % EX GEL: CUTANEOUS | 0 refills | Status: DC

## 2021-09-15 NOTE — Telephone Encounter (Signed)
Patient aware that Rx sent in  °

## 2021-09-15 NOTE — Telephone Encounter (Signed)
Patient calling states that she has some break outs on her face would like to know if Dr. Doreene Burke would send in Epiduo Forte cream to help with this. Please advise.

## 2021-10-10 ENCOUNTER — Other Ambulatory Visit: Payer: Self-pay

## 2021-10-10 DIAGNOSIS — R21 Rash and other nonspecific skin eruption: Secondary | ICD-10-CM

## 2021-10-10 MED ORDER — ADAPALENE-BENZOYL PEROXIDE 0.3-2.5 % EX GEL: CUTANEOUS | 0 refills | Status: DC

## 2021-10-10 NOTE — Telephone Encounter (Signed)
Patient calling with concerns stating Rx for Epiduo was not received at pharmacy. Called pharmacy due to rx showing to be sent on 09/15/21 per pharmacy they did not receive Rx. Resent prescription in patient aware

## 2021-10-26 ENCOUNTER — Other Ambulatory Visit: Payer: Self-pay | Admitting: *Deleted

## 2021-10-26 MED ORDER — FLECAINIDE ACETATE 100 MG PO TABS: ORAL_TABLET | ORAL | 3 refills | Status: DC

## 2021-11-01 ENCOUNTER — Telehealth: Payer: Self-pay | Admitting: Internal Medicine

## 2021-11-01 MED ORDER — FLECAINIDE ACETATE 100 MG PO TABS: ORAL_TABLET | ORAL | 0 refills | Status: DC

## 2021-11-01 NOTE — Telephone Encounter (Signed)
Pt's medication was sent to pt's pharmacy as requested. Confirmation received.  °

## 2021-11-01 NOTE — Telephone Encounter (Signed)
°*  STAT* If patient is at the pharmacy, call can be transferred to refill team.   1. Which medications need to be refilled? (please list name of each medication and dose if known) flecainide (TAMBOCOR) 100 MG tablet  2. Which pharmacy/location (including street and city if local pharmacy) is medication to be sent to? [id number] - [place], [place] - [address]  3. Do they need a 30 day or 90 day supply? 14 days   (Patient have received her meds from [facility] yet and Patient is is going out of town on tomorrow.)

## 2021-11-02 ENCOUNTER — Other Ambulatory Visit: Payer: Self-pay

## 2021-11-02 MED ORDER — FLECAINIDE ACETATE 100 MG PO TABS: ORAL_TABLET | ORAL | 2 refills | Status: DC

## 2021-12-14 ENCOUNTER — Other Ambulatory Visit: Payer: Self-pay | Admitting: Family Medicine

## 2021-12-14 ENCOUNTER — Encounter: Payer: Self-pay | Admitting: Family Medicine

## 2021-12-14 DIAGNOSIS — R21 Rash and other nonspecific skin eruption: Secondary | ICD-10-CM

## 2021-12-18 MED ORDER — ADAPALENE-BENZOYL PEROXIDE 0.3-2.5 % EX GEL: CUTANEOUS | 0 refills | Status: AC

## 2021-12-18 NOTE — Telephone Encounter (Signed)
Pharmacy has been updated.

## 2022-01-08 ENCOUNTER — Other Ambulatory Visit: Payer: Self-pay

## 2022-01-08 DIAGNOSIS — J301 Allergic rhinitis due to pollen: Secondary | ICD-10-CM

## 2022-01-08 MED ORDER — AZELASTINE-FLUTICASONE 137-50 MCG/ACT NA SUSP: NASAL | 3 refills | Status: DC

## 2022-02-09 ENCOUNTER — Encounter: Payer: Self-pay | Admitting: Family Medicine

## 2022-02-09 ENCOUNTER — Other Ambulatory Visit: Payer: Self-pay

## 2022-02-09 MED ORDER — VALACYCLOVIR HCL 1 G PO TABS: ORAL_TABLET | ORAL | 2 refills | Status: DC

## 2022-02-09 NOTE — Telephone Encounter (Signed)
Refill request for pending Rx please advise.  

## 2022-05-22 ENCOUNTER — Other Ambulatory Visit: Payer: Self-pay

## 2022-05-22 MED ORDER — FLECAINIDE ACETATE 100 MG PO TABS: ORAL_TABLET | ORAL | 2 refills | Status: DC

## 2022-05-22 NOTE — Telephone Encounter (Signed)
Pt's medication was sent to pt's pharmacy as requested. Confirmation received.  °

## 2022-07-27 ENCOUNTER — Ambulatory Visit (INDEPENDENT_AMBULATORY_CARE_PROVIDER_SITE_OTHER): Payer: BC Managed Care – PPO | Admitting: Family Medicine

## 2022-07-27 ENCOUNTER — Encounter: Payer: Self-pay | Admitting: Family Medicine

## 2022-07-27 VITALS — BP 114/68 | HR 61 | Temp 97.7°F | Ht 69.0 in | Wt 175.2 lb

## 2022-07-27 DIAGNOSIS — Z0000 Encounter for general adult medical examination without abnormal findings: Secondary | ICD-10-CM

## 2022-07-27 DIAGNOSIS — B001 Herpesviral vesicular dermatitis: Secondary | ICD-10-CM | POA: Diagnosis not present

## 2022-07-27 LAB — URINALYSIS, ROUTINE W REFLEX MICROSCOPIC
Bilirubin Urine: NEGATIVE
Hgb urine dipstick: NEGATIVE
Ketones, ur: NEGATIVE
Leukocytes,Ua: NEGATIVE
Nitrite: NEGATIVE
Specific Gravity, Urine: 1.02 (ref 1.000–1.030)
Total Protein, Urine: NEGATIVE
Urine Glucose: NEGATIVE
Urobilinogen, UA: 1 (ref 0.0–1.0)
pH: 6 (ref 5.0–8.0)

## 2022-07-27 LAB — LIPID PANEL
Cholesterol: 199 mg/dL (ref 0–200)
HDL: 78.3 mg/dL (ref >39.00)
LDL Cholesterol: 109 mg/dL — ABNORMAL HIGH (ref 0–99)
NonHDL: 120.23
Total CHOL/HDL Ratio: 3
Triglycerides: 55 mg/dL (ref 0.0–149.0)
VLDL: 11 mg/dL (ref 0.0–40.0)

## 2022-07-27 LAB — COMPREHENSIVE METABOLIC PANEL
ALT: 11 U/L (ref 0–35)
AST: 15 U/L (ref 0–37)
Albumin: 4.4 g/dL (ref 3.5–5.2)
Alkaline Phosphatase: 57 U/L (ref 39–117)
BUN: 12 mg/dL (ref 6–23)
CO2: 27 mEq/L (ref 19–32)
Calcium: 9.4 mg/dL (ref 8.4–10.5)
Chloride: 103 mEq/L (ref 96–112)
Creatinine, Ser: 0.81 mg/dL (ref 0.40–1.20)
GFR: 88.41 mL/min (ref >60.00)
Glucose, Bld: 78 mg/dL (ref 70–99)
Potassium: 3.9 mEq/L (ref 3.5–5.1)
Sodium: 137 mEq/L (ref 135–145)
Total Bilirubin: 0.8 mg/dL (ref 0.2–1.2)
Total Protein: 7.3 g/dL (ref 6.0–8.3)

## 2022-07-27 LAB — CBC
HCT: 43.1 % (ref 36.0–46.0)
Hemoglobin: 14.8 g/dL (ref 12.0–15.0)
MCHC: 34.4 g/dL (ref 30.0–36.0)
MCV: 94.1 fl (ref 78.0–100.0)
Platelets: 163 10*3/uL (ref 150.0–400.0)
RBC: 4.58 Mil/uL (ref 3.87–5.11)
RDW: 12.3 % (ref 11.5–15.5)
WBC: 4.9 10*3/uL (ref 4.0–10.5)

## 2022-07-27 MED ORDER — VALACYCLOVIR HCL 500 MG PO TABS: ORAL_TABLET | ORAL | 8 refills | Status: AC

## 2022-07-27 NOTE — Progress Notes (Signed)
Established Patient Office Visit  Subjective   Patient ID: Heather Rice, female    DOB: [birth year]  Age: 46 y.o. MRN: [id number]  Chief Complaint  Patient presents with   Annual Exam    CPE, no concerns. Patient fasting.     HPI here with her elderly mom for health check.  She has been doing well.  Has access for regular dental care and is seen twice yearly for cleanings.  Periodic eruption of fever blisters around the mouth.    Review of Systems  Constitutional: Negative.   HENT: Negative.    Eyes:  Negative for blurred vision, discharge and redness.  Respiratory: Negative.    Cardiovascular: Negative.   Gastrointestinal:  Negative for abdominal pain.  Genitourinary: Negative.   Musculoskeletal: Negative.  Negative for myalgias.  Skin:  Positive for rash.  Neurological:  Negative for tingling, loss of consciousness and weakness.  Endo/Heme/Allergies:  Negative for polydipsia.      07/27/2022    9:00 AM 07/27/2022    8:23 AM 07/12/2021   10:43 AM  Depression screen PHQ 2/9  Decreased Interest 1 0 0  Down, Depressed, Hopeless 0 0 0  PHQ - 2 Score 1 0 0  Altered sleeping 0  0  Tired, decreased energy 1  0  Change in appetite 0  0  Feeling bad or failure about yourself  0  0  Trouble concentrating 0  0  Moving slowly or fidgety/restless 0  0  Suicidal thoughts 0  0  PHQ-9 Score 2  0  Difficult doing work/chores Not difficult at all  Not difficult at all        Objective:     BP 114/68 (BP Location: Left Arm, Patient Position: Sitting, Cuff Size: Normal)   Pulse 61   Temp 97.7 F (36.5 C) (Temporal)   Ht 5\' 9"  (1.753 m)   Wt 175 lb 3.2 oz (79.5 kg)   SpO2 98%   BMI 25.87 kg/m    Physical Exam Constitutional:      General: She is not in acute distress.    Appearance: Normal appearance. She is not ill-appearing, toxic-appearing or diaphoretic.  HENT:     Head: Normocephalic and atraumatic.     Right Ear: External ear normal.     Left Ear: External  ear normal.     Mouth/Throat:     Mouth: Mucous membranes are moist.     Pharynx: Oropharynx is clear. No oropharyngeal exudate or posterior oropharyngeal erythema.  Eyes:     General: No scleral icterus.       Right eye: No discharge.        Left eye: No discharge.     Extraocular Movements: Extraocular movements intact.     Conjunctiva/sclera: Conjunctivae normal.     Pupils: Pupils are equal, round, and reactive to light.  Cardiovascular:     Rate and Rhythm: Normal rate and regular rhythm.  Pulmonary:     Effort: Pulmonary effort is normal. No respiratory distress.     Breath sounds: Normal breath sounds.  Abdominal:     General: Bowel sounds are normal.  Musculoskeletal:     Cervical back: No rigidity or tenderness.  Lymphadenopathy:     Cervical: No cervical adenopathy.  Skin:    General: Skin is warm and dry.  Neurological:     Mental Status: She is alert and oriented to person, place, and time.  Psychiatric:        Mood and Affect:  Mood normal.        Behavior: Behavior normal.      No results found for any visits on 07/27/22.    The 10-year ASCVD risk score (Arnett DK, et al., 2019) is: 0.4%    Assessment & Plan:   Problem List Items Addressed This Visit       Digestive   Herpes labialis   Relevant Medications   valACYclovir (VALTREX) 500 MG tablet     Other   Healthcare maintenance - Primary   Relevant Orders   CBC   Lipid panel   Comprehensive metabolic panel   Urinalysis, Routine w reflex microscopic    Return in about 1 year (around 07/28/2023), or if symptoms worsen or fail to improve.  Encouraged 30 minutes of exercise at least 5 days weekly.  Walking is fine.  Libby Maw, MD

## 2022-07-31 IMAGING — US US THYROID
1 series · 14 of 25 positions shown · non-contrast
Comparison: None.

CLINICAL DATA: 44-year-old female with a history of nontoxic
thyroid goiter

EXAM:
THYROID ULTRASOUND
TECHNIQUE: Ultrasound examination of the thyroid gland and adjacent soft
tissues was performed.

[Series 1: us thyroid · 0.04mm/px · 14 of 48 slices shown]
[im 1/48]
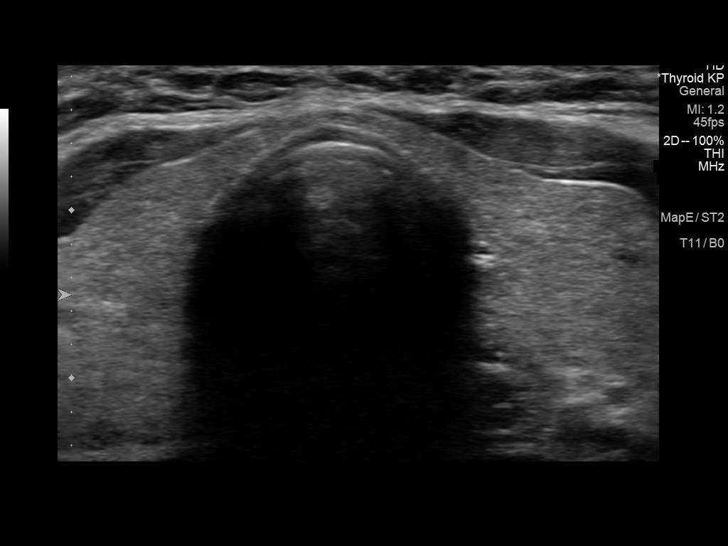
[im 4/48]
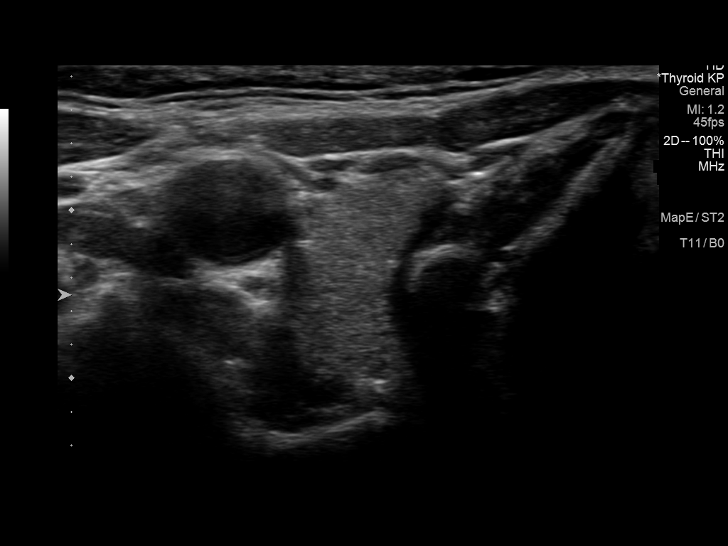
[im 8/48]
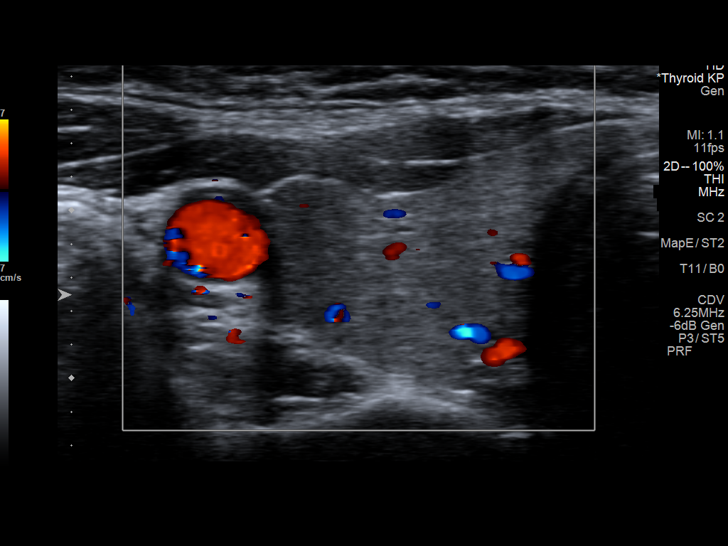
[im 12/48]
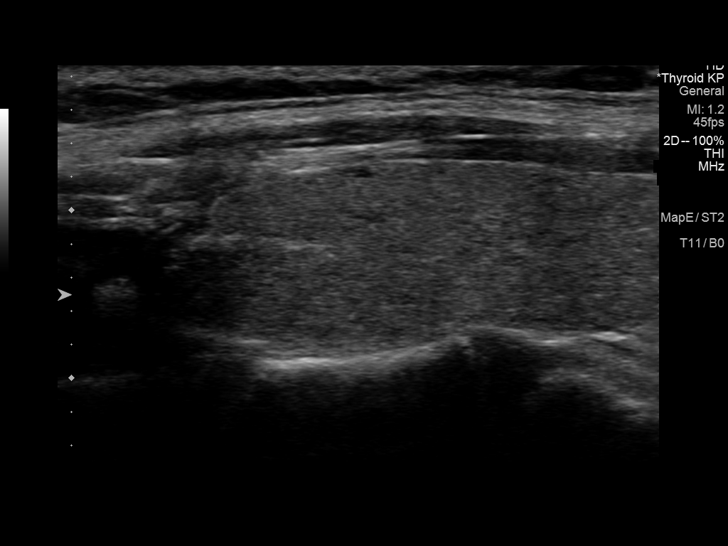
[im 16/48]
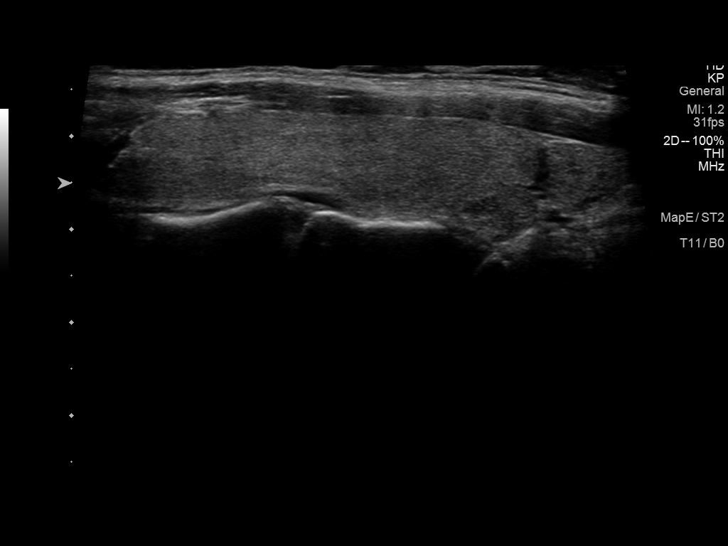
[im 18/48]
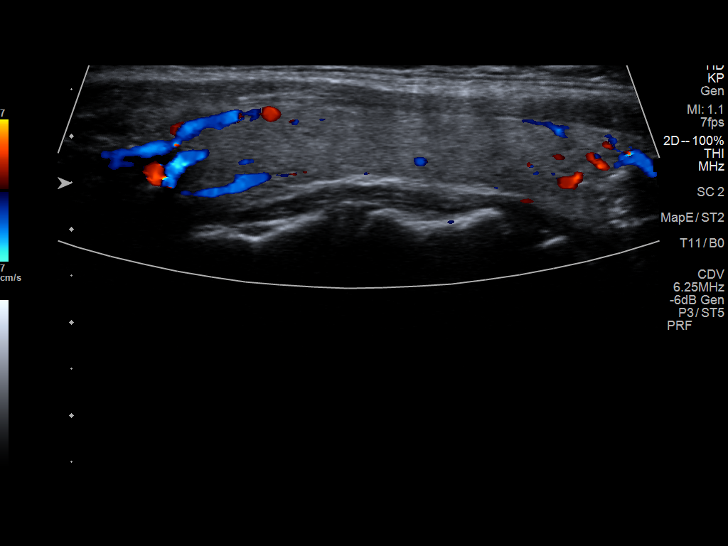
[im 22/48]
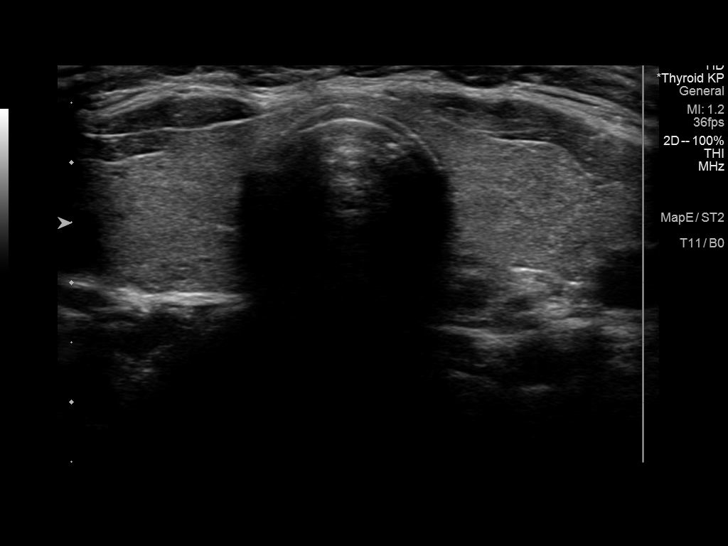
[im 26/48]
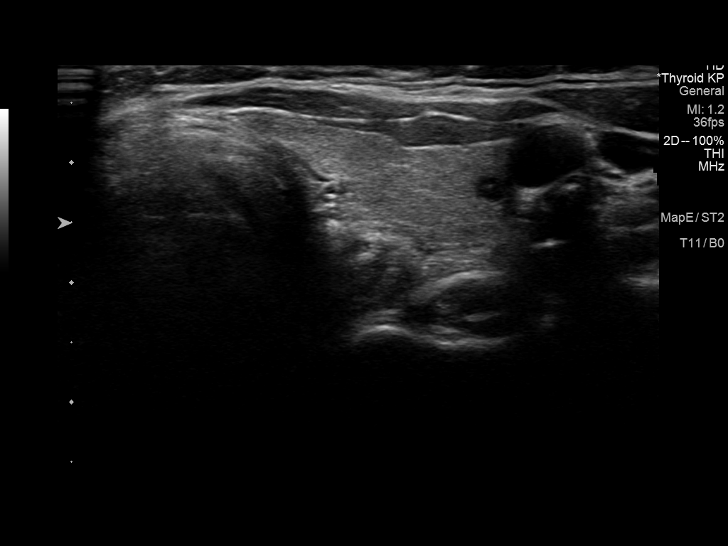
[im 30/48]
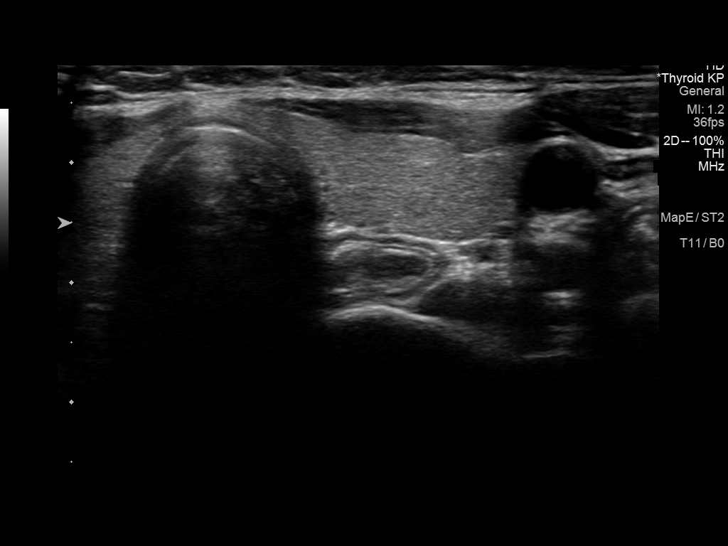
[im 32/48]
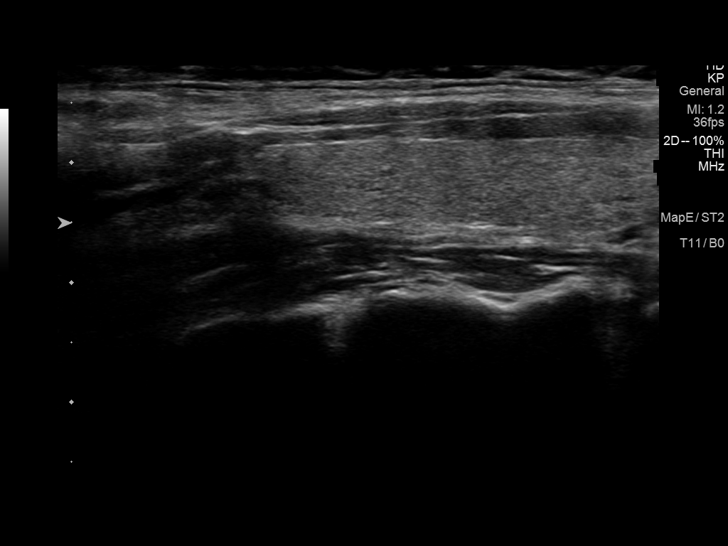
[im 36/48]
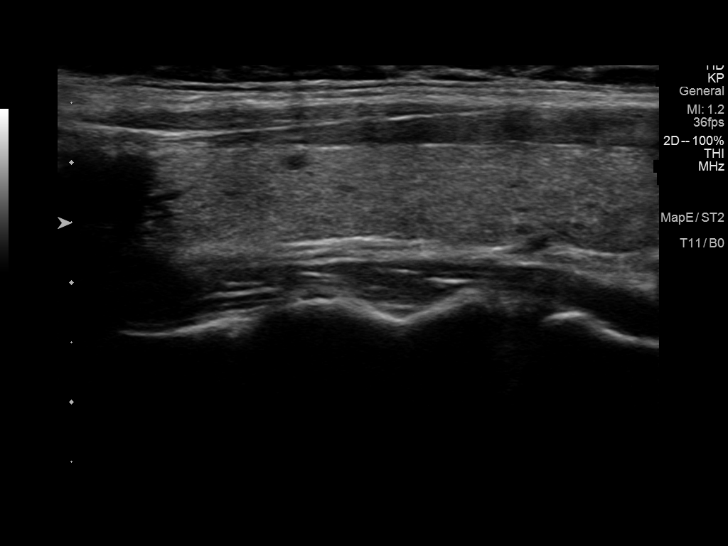
[im 40/48]
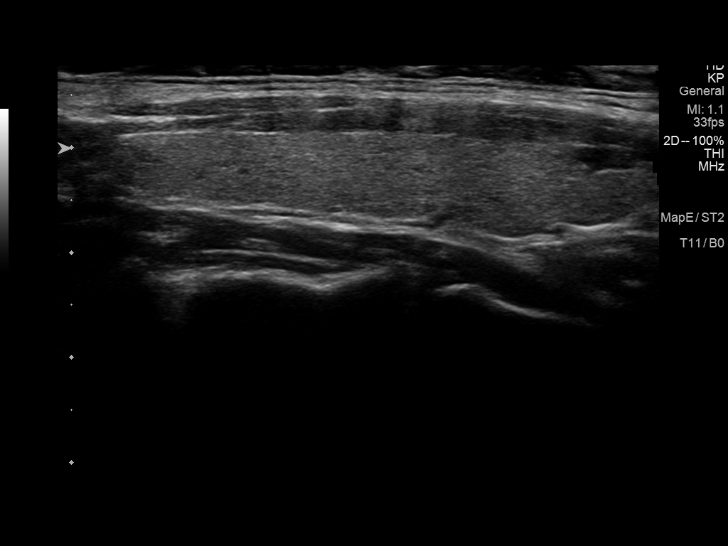
[im 44/48]
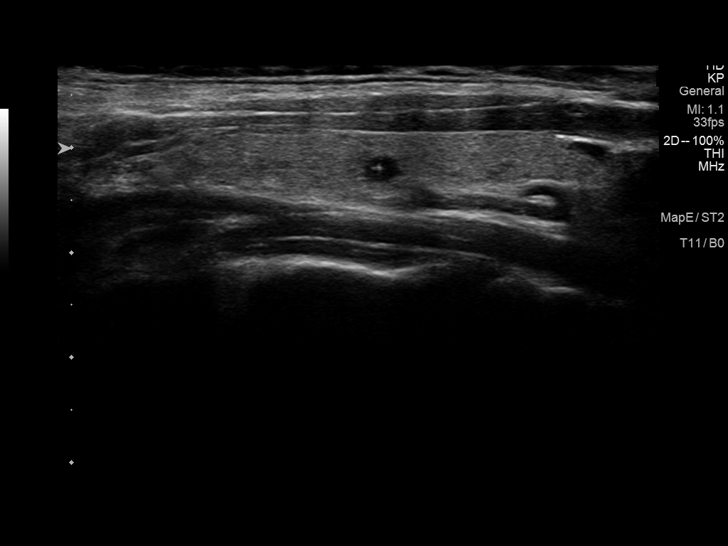
[im 48/48]
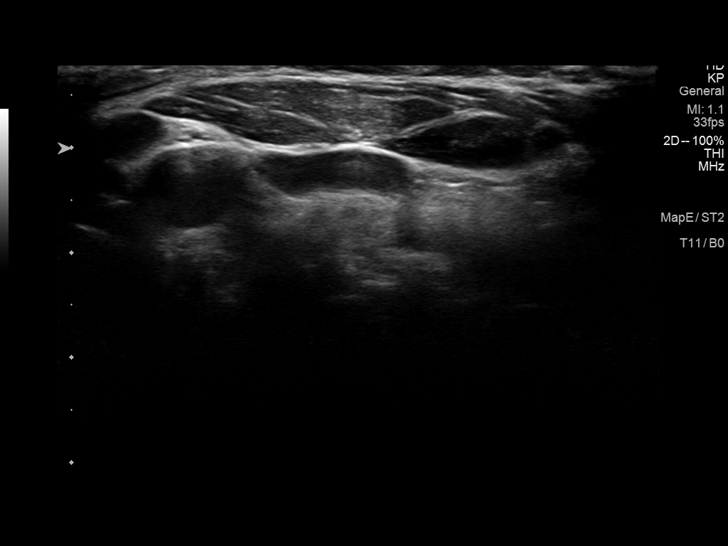

[14 of 25 positions shown; findings below may reference images not displayed]

FINDINGS: Parenchymal Echotexture: Normal

Isthmus: 0.1 cm

Right lobe: 5.7 cm x 1.2 cm x 1.6 cm

Left lobe: 5.3 cm x 0.8 cm x 1.8 cm

_________________________________________________________

Estimated total number of nodules >/= 1 cm: 0

Number of spongiform nodules >/=  2 cm not described below (TR1): 0

Number of mixed cystic and solid nodules >/= 1.5 cm not described
below (TR2): 0

_________________________________________________________

Small cystic nodules/colloid cyst do not meet criteria for
surveillance or biopsy.

No adenopathy
IMPRESSION: Relatively unremarkable sonographic survey of the thyroid

## 2022-08-20 ENCOUNTER — Telehealth: Payer: Self-pay | Admitting: Family Medicine

## 2022-08-20 NOTE — Telephone Encounter (Signed)
Pt states she faxed in a form earlier today 08/20/22 to [phone]. Form needs to be completed and returned for her insurance plan. Please call and let her know when completed.   Name: Heather Rice  Ph# [phone]

## 2022-08-22 NOTE — Telephone Encounter (Signed)
Caller Name: Karess Harner Call back phone #: [phone]  Reason for Call: Please call pt with an update about her forms.

## 2022-08-22 NOTE — Telephone Encounter (Signed)
Duplicate message. 

## 2022-08-22 NOTE — Telephone Encounter (Signed)
Not in the office when forms were faxed over have not seen any forms for patient today as I returned to the office have either seen or faxed ant forms regarding patient? Please advise

## 2022-08-23 ENCOUNTER — Encounter: Payer: Self-pay | Admitting: Family Medicine

## 2022-08-23 NOTE — Telephone Encounter (Signed)
Spoke to patient and she will upload her physical form from work to be filled out to my chart and send it to [place] that way.  We never received the fax.  Dm/cma

## 2022-08-24 NOTE — Telephone Encounter (Signed)
PAPERWORK/FORMS received  Dropped off by: Renaldo Reel Call back #: [phone] Individual made aware of 3-5 business day turn around (YES/NO): yes GREEN charge sheet completed and patient made aware of possible charge (YES/NO): yes Placed in provider folder at front desk. ~~~ route to CMA/provider Team  CLINICAL USE BELOW THIS LINE (use X to signify action taken)  ___ Form received and placed in providers office for signature. ___ Form completed and faxed to LOA Dept.  ___ Form completed & LVM to notify patient ready for pick up.  ___ Charge sheet and copy of form in front office folder for office supervisor.

## 2022-08-27 NOTE — Telephone Encounter (Signed)
Pt urged that forms needed to be completed. She is available to pick them up as soon as completed

## 2022-08-27 NOTE — Telephone Encounter (Signed)
Form received and viewed and signed placed up front ready for pick up.

## 2022-10-02 ENCOUNTER — Other Ambulatory Visit: Payer: Self-pay | Admitting: Family

## 2022-10-02 ENCOUNTER — Other Ambulatory Visit: Payer: Self-pay | Admitting: Family Medicine

## 2022-11-01 ENCOUNTER — Telehealth: Payer: Self-pay

## 2022-11-01 ENCOUNTER — Other Ambulatory Visit: Payer: Self-pay | Admitting: Internal Medicine

## 2022-11-01 MED ORDER — FLECAINIDE ACETATE 100 MG PO TABS: ORAL_TABLET | ORAL | 0 refills | Status: DC

## 2022-11-01 NOTE — Addendum Note (Signed)
Addended by: Michelle Nasuti on: 11/01/2022 08:35 AM   Modules accepted: Orders

## 2022-11-01 NOTE — Addendum Note (Signed)
Addended by: Michelle Nasuti on: 11/01/2022 08:39 AM   Modules accepted: Orders

## 2022-11-01 NOTE — Telephone Encounter (Signed)
I called [facility] to see if the prescription for flecainide was received at the pharmacy and I was told yes that the patient picked up a refill on yesterday 10/31/2022.

## 2022-11-01 NOTE — Addendum Note (Signed)
Addended by: Michelle Nasuti on: 11/01/2022 08:31 AM   Modules accepted: Orders

## 2023-01-06 NOTE — Progress Notes (Unsigned)
Cardiology Office Note Date:  01/07/2023  Patient ID:  Heather Rice, Heather Rice [birth year], MRN [id number] PCP:  Libby Maw, MD  Electrophysiologist: Dr. Caryl Comes   Chief Complaint:   overdue  History of Present Illness: Heather Rice is a 47 y.o. female with history of VT, NSVT, PVC's, treated with BB and Flecainide, she was referred to Dr. Rayann Heman and underwent EPS without inducible VT, subsequently at her last office visit, a f/u with Dr. Rayann Heman, (Jan 2017) in discussion of treatment options (weaning Flecainide and consider ablation if recurrent VT or continuing meds and watchful waiting), watchful waiting and ongoing medical therapy with Flecainide was decided.  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 No CP, no SOB, no exertional intolerances. She has had very fleeting dizzy spell here/there, though is less then a second, nothing more No near syncope or syncope. Stable intervals No changes were made  She saw Dr. Caryl Comes 07/17/21, discussed occasional observations of low BP/HR with some fatigue, though not overtly symptomatic, anxiety improved on buspar  No changes were made.  TODAY She continues to do well Palpitations/rhythm remains well controlled No CP, SOB Works a Network engineer job, doesn't exercise much, but reports no difficulties with ADLs or exertional intolerances. No dizzy spells, near syncope or syncope. Had a great year last year, traveled quite a bit  Sees her PMD regularly, has labs there   Past Medical History:  Diagnosis Date   History of chicken pox    Thyroid cyst 2004   After having her daughter but resolved   VT (ventricular tachycardia) ([place])     Past Surgical History:  Procedure Laterality Date   ELECTROPHYSIOLOGIC STUDY N/A 09/27/2015   Procedure: PVC Ablation;  Surgeon: Thompson Grayer, MD;   Location: [facility];  Service: Cardiovascular;  Laterality: N/A;    Current Outpatient Medications  Medication Sig Dispense Refill   Adapalene-Benzoyl Peroxide (EPIDUO FORTE) 0.3-2.5 % GEL Apply a thin coat twice daily for 2 weeks and then see me in clinic. 45 g 0   Azelastine-Fluticasone 137-50 MCG/ACT SUSP ONE SPRAY IN EACH NARE TWICE DAILY 23 g 3   busPIRone (BUSPAR) 5 MG tablet Take 2.5 mg by mouth 2 (two) times daily.     cholecalciferol (VITAMIN D) 1000 UNITS tablet Take 1,000 Units by mouth daily.     levonorgestrel (MIRENA) 20 MCG/24HR IUD 1 each by Intrauterine route once. Implanted April 2018     valACYclovir (VALTREX) 1000 MG tablet TAKE 1/2 TABLET(500 MG) BY MOUTH TWICE DAILY FOR 3 DAYS 9 tablet 1   valACYclovir (VALTREX) 500 MG tablet Take 1 tablet twice daily for 3 days as needed for outbreaks. 6 tablet 8   flecainide (TAMBOCOR) 100 MG tablet TAKE 1 TABLET BY MOUTH EVERY MORNING AND 1.5 TABLETS BY MOUTH EVERY EVENING. 225 tablet 3   No current facility-administered medications for this visit.    Allergies:   Patient has no known allergies.   Social History:  The patient  reports that she has never smoked. She has never used smokeless tobacco. She reports that she does not drink alcohol and does not use drugs.   Family History:  The patient's family history includes Arthritis in her mother; Birth defects in her maternal grandfather; Cancer in her paternal grandfather; Diabetes in her maternal grandmother, mother, and paternal grandmother; Early death  in her father.  ROS:  Please see the history of present illness.  All other systems are reviewed and otherwise negative.   PHYSICAL EXAM:  VS:  BP 118/60   Pulse 62   Ht 5\' 9"  (1.753 m)   Wt 177 lb 9.6 oz (80.6 kg)   LMP  (LMP Unknown)   SpO2 97%   BMI 26.23 kg/m  BMI: Body mass index is 26.23 kg/m. Well nourished, well developed, in no acute distress  HEENT: normocephalic, atraumatic  Neck: no JVD, carotid  bruits or masses Cardiac:  RRR; no significant murmurs, no rubs, or gallops Lungs: CTA b/l, no wheezing, rhonchi or rales  Abd: soft, nontender MS: no deformity or atrophy Ext:  no edema Skin: warm and dry, no rash Neuro:  No gross deficits appreciated Psych: euthymic mood, full affect    EKG:  Done today and reviewed by myself:   SR 62, PR 17ms, QRS 162ms, QTc 458ms 07/17/21: SR 62bpm, PR 168ms, QRS 59ms, QTc 434ms 11/09/20 SR 63bpm, PR 146ms, QRS 61msQtc 482ms, nonspecific T changes similar to others 06/22/20:   SR 65bpm, PR 149ms, QRS 86ms, QTc 416, unchanged from prior 02/22/20: SR 63bpm, PR 132ms, QRS 47ms, QTc 477ms, nonspecific ST/T changes, appears unchanged 06/02/18: SR 66bpm, PR 137ms, QRS 40ms, QTc 426ms, nonspecific T changes, unchanged from prior 12/28/16: SB, 58bpm, PR 149ms, QRS 67ms, QTc 447ms, nonspecific T changes look similar to previous   06/29/2020: TTE IMPRESSIONS   1. Left ventricular ejection fraction, by estimation, is 60 to 65%. The  left ventricle has normal function. The left ventricle has no regional  wall motion abnormalities. Left ventricular diastolic parameters were  normal.   2. Right ventricular systolic function is normal. The right ventricular  size is normal.   3. The mitral valve is grossly normal. No evidence of mitral valve  regurgitation.   4. The aortic valve was not well visualized. Aortic valve regurgitation  is not visualized.   5. The inferior vena cava is normal in size with greater than 50%  respiratory variability, suggesting right atrial pressure of 3 mmHg.   Conclusion(s)/Recommendation(s): Normal biventricular function without  evidence of hemodynamically significant valvular heart disease.    Oct 2021, monitor Findings HR  avg 71  Min 47-Max 140  PVCs Rare, less than 1%   PACs Rare, less than 1%   SVT Nonsustained 1 episodes    Multiple triggered events assoc with isolated PVC    03/30/14: TTE LVEF 60-65% No  significant valve disease  02/17/13: Stress myoview Normal myocardial perfusion study   Recent Labs: 07/27/2022: ALT 11; BUN 12; Creatinine, Ser 0.81; Hemoglobin 14.8; Platelets 163.0; Potassium 3.9; Sodium 137  07/27/2022: Cholesterol 199; HDL 78.30; LDL Cholesterol 109; Total CHOL/HDL Ratio 3; Triglycerides 55.0; VLDL 11.0   CrCl cannot be calculated (Patient's most recent lab result is older than the maximum 21 days allowed.).   Wt Readings from Last 3 Encounters:  01/07/23 177 lb 9.6 oz (80.6 kg)  07/27/22 175 lb 3.2 oz (79.5 kg)  07/17/21 177 lb 6.4 oz (80.5 kg)     Other studies reviewed: Additional studies/records reviewed today include: summarized above  ASSESSMENT AND PLAN:  1. VT     on Flecainide, stable intervals     Record indicates intolerant of CCB, BB, she recalls feeling awful with metoprolol     palpitations well controlled, no symptoms of recurrent arrhythmia    Disposition: will have her back in 64mo, sooner if needed  Current medicines are reviewed at length with the patient today.  The patient did not have any concerns regarding medicines.  Haywood Lasso, PA-C 01/07/2023 3:21 PM     [address] [address] [phone] (office)  [phone] (fax)

## 2023-01-07 ENCOUNTER — Encounter: Payer: Self-pay | Admitting: Physician Assistant

## 2023-01-07 ENCOUNTER — Ambulatory Visit: Payer: BC Managed Care – PPO | Attending: Physician Assistant | Admitting: Physician Assistant

## 2023-01-07 VITALS — BP 118/60 | HR 62 | Ht 69.0 in | Wt 177.6 lb

## 2023-01-07 DIAGNOSIS — I472 Ventricular tachycardia, unspecified: Secondary | ICD-10-CM

## 2023-01-07 DIAGNOSIS — Z79899 Other long term (current) drug therapy: Secondary | ICD-10-CM | POA: Diagnosis not present

## 2023-01-07 DIAGNOSIS — Z5181 Encounter for therapeutic drug level monitoring: Secondary | ICD-10-CM

## 2023-01-07 MED ORDER — FLECAINIDE ACETATE 100 MG PO TABS: ORAL_TABLET | ORAL | 3 refills | Status: DC

## 2023-01-07 NOTE — Patient Instructions (Signed)
Medication Instructions:   *If you need a refill on your cardiac medications before your next appointment, please call your pharmacy*   Lab Work: NONE ORDERED  TODAY   If you have labs (blood work) drawn today and your tests are completely normal, you will receive your results only by: [place] (if you have MyChart) OR A paper copy in the mail If you have any lab test that is abnormal or we need to change your treatment, we will call you to review the results.   Testing/Procedures: NONE ORDERED  TODAY     Follow-Up: At [facility], you and your health needs are our priority.  As part of our continuing mission to provide you with exceptional heart care, we have created designated Provider Care Teams.  These Care Teams include your primary Cardiologist (physician) and Advanced Practice Providers (APPs -  Physician Assistants and Nurse Practitioners) who all work together to provide you with the care you need, when you need it.  We recommend signing up for the patient portal called "MyChart".  Sign up information is provided on this After Visit Summary.  MyChart is used to connect with patients for Virtual Visits (Telemedicine).  Patients are able to view lab/test results, encounter notes, upcoming appointments, etc.  Non-urgent messages can be sent to your provider as well.   To learn more about what you can do with MyChart, go to [email or web address].    Your next appointment:   6 month(s)  Provider:   Tommye Standard, PA-C    Other Instructions

## 2023-05-27 ENCOUNTER — Other Ambulatory Visit: Payer: Self-pay | Admitting: Family Medicine

## 2023-05-27 DIAGNOSIS — J301 Allergic rhinitis due to pollen: Secondary | ICD-10-CM

## 2023-07-29 ENCOUNTER — Encounter: Payer: BC Managed Care – PPO | Admitting: Family Medicine

## 2023-07-29 ENCOUNTER — Telehealth: Payer: Self-pay | Admitting: Family Medicine

## 2023-07-29 NOTE — Telephone Encounter (Signed)
pt called today @ 1:00pm to cancel/reschedule, 1st missed visit

## 2023-07-29 NOTE — Telephone Encounter (Signed)
10.14.24 no show/ no show letter sent

## 2023-09-20 ENCOUNTER — Other Ambulatory Visit: Payer: Self-pay | Admitting: Family Medicine

## 2023-10-29 ENCOUNTER — Ambulatory Visit (INDEPENDENT_AMBULATORY_CARE_PROVIDER_SITE_OTHER): Payer: BC Managed Care – PPO | Admitting: Family Medicine

## 2023-10-29 ENCOUNTER — Encounter: Payer: Self-pay | Admitting: Family Medicine

## 2023-10-29 VITALS — BP 108/64 | HR 57 | Temp 98.3°F | Ht 69.0 in | Wt 183.0 lb

## 2023-10-29 DIAGNOSIS — Z13220 Encounter for screening for lipoid disorders: Secondary | ICD-10-CM

## 2023-10-29 DIAGNOSIS — Z0000 Encounter for general adult medical examination without abnormal findings: Secondary | ICD-10-CM | POA: Diagnosis not present

## 2023-10-29 DIAGNOSIS — Z1211 Encounter for screening for malignant neoplasm of colon: Secondary | ICD-10-CM

## 2023-10-29 DIAGNOSIS — Z131 Encounter for screening for diabetes mellitus: Secondary | ICD-10-CM | POA: Diagnosis not present

## 2023-10-29 DIAGNOSIS — R3129 Other microscopic hematuria: Secondary | ICD-10-CM

## 2023-10-29 DIAGNOSIS — Z5320 Procedure and treatment not carried out because of patient's decision for unspecified reasons: Secondary | ICD-10-CM

## 2023-10-29 LAB — URINALYSIS, ROUTINE W REFLEX MICROSCOPIC
Bilirubin Urine: NEGATIVE
Ketones, ur: NEGATIVE
Leukocytes,Ua: NEGATIVE
Nitrite: NEGATIVE
Specific Gravity, Urine: 1.02 (ref 1.000–1.030)
Total Protein, Urine: NEGATIVE
Urine Glucose: NEGATIVE
Urobilinogen, UA: 0.2 (ref 0.0–1.0)
pH: 6 (ref 5.0–8.0)

## 2023-10-29 LAB — COMPREHENSIVE METABOLIC PANEL
ALT: 13 U/L (ref 0–35)
AST: 15 U/L (ref 0–37)
Albumin: 4.5 g/dL (ref 3.5–5.2)
Alkaline Phosphatase: 55 U/L (ref 39–117)
BUN: 14 mg/dL (ref 6–23)
CO2: 30 meq/L (ref 19–32)
Calcium: 9.4 mg/dL (ref 8.4–10.5)
Chloride: 103 meq/L (ref 96–112)
Creatinine, Ser: 0.81 mg/dL (ref 0.40–1.20)
GFR: 87.63 mL/min (ref >60.00)
Glucose, Bld: 88 mg/dL (ref 70–99)
Potassium: 3.9 meq/L (ref 3.5–5.1)
Sodium: 139 meq/L (ref 135–145)
Total Bilirubin: 0.7 mg/dL (ref 0.2–1.2)
Total Protein: 7.1 g/dL (ref 6.0–8.3)

## 2023-10-29 LAB — LIPID PANEL
Cholesterol: 206 mg/dL — ABNORMAL HIGH (ref 0–200)
HDL: 74.5 mg/dL (ref >39.00)
LDL Cholesterol: 114 mg/dL — ABNORMAL HIGH (ref 0–99)
NonHDL: 131.28
Total CHOL/HDL Ratio: 3
Triglycerides: 87 mg/dL (ref 0.0–149.0)
VLDL: 17.4 mg/dL (ref 0.0–40.0)

## 2023-10-29 LAB — CBC
HCT: 43.2 % (ref 36.0–46.0)
Hemoglobin: 14.5 g/dL (ref 12.0–15.0)
MCHC: 33.7 g/dL (ref 30.0–36.0)
MCV: 96.4 fL (ref 78.0–100.0)
Platelets: 182 10*3/uL (ref 150.0–400.0)
RBC: 4.48 Mil/uL (ref 3.87–5.11)
RDW: 12.6 % (ref 11.5–15.5)
WBC: 5.8 10*3/uL (ref 4.0–10.5)

## 2023-10-29 LAB — HEMOGLOBIN A1C: Hgb A1c MFr Bld: 5 % (ref 4.6–6.5)

## 2023-10-29 NOTE — Progress Notes (Addendum)
 Established Patient Office Visit   Subjective:  Patient ID: Heather Rice, female    DOB: [birth year]  Age: 48 y.o. MRN: [id number]  Chief Complaint  Patient presents with   Annual Exam    CPE/ Pt is fasting.     HPI Encounter Diagnoses  Name Primary?   Healthcare maintenance Yes   Screening for colon cancer    Screening for cholesterol level    Screening for diabetes mellitus    Screening for hepatitis C declined    Other microscopic hematuria    Doing well.  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  She continues to work full-time and live with her husband.  She has regular dental care.  Has not been exercising regularly but is active at home and at church.  Status post Pap smear back in June.  Rarely has palpitations after starting Tambocor .   Review of Systems  Constitutional: Negative.   HENT: Negative.    Eyes:  Negative for blurred vision, discharge and redness.  Respiratory: Negative.    Cardiovascular: Negative.  Negative for palpitations.  Gastrointestinal:  Negative for abdominal pain.  Genitourinary: Negative.   Musculoskeletal: Negative.  Negative for myalgias.  Skin:  Negative for rash.  Neurological:  Negative for tingling, loss of consciousness and weakness.  Endo/Heme/Allergies:  Negative for polydipsia.      10/29/2023    8:34 AM 07/27/2022    9:00 AM 07/27/2022    8:23 AM  Depression screen PHQ 2/9  Decreased Interest 0 1 0  Down, Depressed, Hopeless 0 0 0  PHQ - 2 Score 0 1 0  Altered sleeping 0 0   Tired, decreased energy 0 1   Change in appetite 0 0   Feeling bad or failure about yourself  0 0   Trouble concentrating 0 0   Moving slowly or fidgety/restless 0 0   Suicidal thoughts 0 0   PHQ-9 Score 0 2   Difficult doing work/chores Not difficult at all Not difficult at all        Current Outpatient Medications:    Adapalene -Benzoyl Peroxide  (EPIDUO  FORTE) 0.3-2.5 % GEL, Apply a thin coat twice daily for 2 weeks  and then see me in clinic., Disp: 45 g, Rfl: 0   Azelastine -Fluticasone  137-50 MCG/ACT SUSP, SHAKE LIQUID AND USE 1 SPRAY IN EACH NOSTRIL TWICE DAILY, Disp: 23 g, Rfl: 3   busPIRone (BUSPAR) 5 MG tablet, Take 2.5 mg by mouth 2 (two) times daily., Disp: , Rfl:    cholecalciferol (VITAMIN D ) 1000 UNITS tablet, Take 1,000 Units by mouth daily., Disp: , Rfl:    flecainide  (TAMBOCOR ) 100 MG tablet, TAKE 1 TABLET BY MOUTH EVERY MORNING AND 1.5 TABLETS BY MOUTH EVERY EVENING., Disp: 225 tablet, Rfl: 3   levonorgestrel (MIRENA) 20 MCG/24HR IUD, 1 each by Intrauterine route once. Implanted April 2018, Disp: , Rfl:    valACYclovir  (VALTREX ) 1000 MG tablet, TAKE 1/2 TABLET(500 MG) BY MOUTH TWICE DAILY FOR 3 DAYS, Disp: 9 tablet, Rfl: 1   valACYclovir  (VALTREX ) 500 MG tablet, Take 1 tablet twice daily for 3 days as needed for outbreaks., Disp: 6 tablet, Rfl: 8   Objective:     BP 108/64   Pulse (!) 57   Temp 98.3 F (36.8 C)   Ht 5' 9 (1.753 m)   Wt 183 lb (83 kg)   SpO2 98%   BMI 27.02 kg/m    Physical Exam Constitutional:  General: She is not in acute distress.    Appearance: Normal appearance. She is not ill-appearing, toxic-appearing or diaphoretic.  HENT:     Head: Normocephalic and atraumatic.     Right Ear: Tympanic membrane, ear canal and external ear normal.     Left Ear: Tympanic membrane, ear canal and external ear normal.     Mouth/Throat:     Mouth: Mucous membranes are moist.     Pharynx: Oropharynx is clear. No oropharyngeal exudate or posterior oropharyngeal erythema.  Eyes:     General: No scleral icterus.       Right eye: No discharge.        Left eye: No discharge.     Extraocular Movements: Extraocular movements intact.     Conjunctiva/sclera: Conjunctivae normal.     Pupils: Pupils are equal, round, and reactive to light.  Cardiovascular:     Rate and Rhythm: Normal rate and regular rhythm.  Pulmonary:     Effort: Pulmonary effort is normal. No respiratory  distress.     Breath sounds: Normal breath sounds. No wheezing, rhonchi or rales.  Abdominal:     General: Bowel sounds are normal.  Musculoskeletal:     Cervical back: No rigidity or tenderness.  Skin:    General: Skin is warm and dry.  Neurological:     Mental Status: She is alert and oriented to person, place, and time.  Psychiatric:        Mood and Affect: Mood normal.        Behavior: Behavior normal.      Results for orders placed or performed in visit on 10/29/23  CBC  Result Value Ref Range   WBC 5.8 4.0 - 10.5 K/uL   RBC 4.48 3.87 - 5.11 Mil/uL   Platelets 182.0 150.0 - 400.0 K/uL   Hemoglobin 14.5 12.0 - 15.0 g/dL   HCT 56.7 63.9 - 53.9 %   MCV 96.4 78.0 - 100.0 fl   MCHC 33.7 30.0 - 36.0 g/dL   RDW 87.3 88.4 - 84.4 %  Hemoglobin A1c  Result Value Ref Range   Hgb A1c MFr Bld 5.0 4.6 - 6.5 %  Lipid panel  Result Value Ref Range   Cholesterol 206 (H) 0 - 200 mg/dL   Triglycerides 12.9 0.0 - 149.0 mg/dL   HDL 25.49 >60.99 mg/dL   VLDL 82.5 0.0 - 59.9 mg/dL   LDL Cholesterol 885 (H) 0 - 99 mg/dL   Total CHOL/HDL Ratio 3    NonHDL 131.28   Comprehensive metabolic panel  Result Value Ref Range   Sodium 139 135 - 145 mEq/L   Potassium 3.9 3.5 - 5.1 mEq/L   Chloride 103 96 - 112 mEq/L   CO2 30 19 - 32 mEq/L   Glucose, Bld 88 70 - 99 mg/dL   BUN 14 6 - 23 mg/dL   Creatinine, Ser 9.18 0.40 - 1.20 mg/dL   Total Bilirubin 0.7 0.2 - 1.2 mg/dL   Alkaline Phosphatase 55 39 - 117 U/L   AST 15 0 - 37 U/L   ALT 13 0 - 35 U/L   Total Protein 7.1 6.0 - 8.3 g/dL   Albumin 4.5 3.5 - 5.2 g/dL   GFR 12.36 >39.99 mL/min   Calcium 9.4 8.4 - 10.5 mg/dL  Urinalysis, Routine w reflex microscopic  Result Value Ref Range   Color, Urine YELLOW Yellow;Lt. Yellow;Straw;Dark Yellow;Amber;Green;Red;Brown   APPearance CLEAR Clear;Turbid;Slightly Cloudy;Cloudy   Specific Gravity, Urine 1.020 1.000 - 1.030  pH 6.0 5.0 - 8.0   Total Protein, Urine NEGATIVE Negative   Urine Glucose  NEGATIVE Negative   Ketones, ur NEGATIVE Negative   Bilirubin Urine NEGATIVE Negative   Hgb urine dipstick LARGE (A) Negative   Urobilinogen, UA 0.2 0.0 - 1.0   Leukocytes,Ua NEGATIVE Negative   Nitrite NEGATIVE Negative   WBC, UA 0-2/hpf 0-2/hpf   RBC / HPF 11-20/hpf (A) 0-2/hpf   Squamous Epithelial / HPF Rare(0-4/hpf) Rare(0-4/hpf)      The 10-year ASCVD risk score (Arnett DK, et al., 2019) is: 0.4%    Assessment & Plan:   Healthcare maintenance -     CBC -     Urinalysis, Routine w reflex microscopic  Screening for colon cancer -     Ambulatory referral to Gastroenterology  Screening for cholesterol level -     Lipid panel -     Comprehensive metabolic panel  Screening for diabetes mellitus -     Hemoglobin A1c -     Comprehensive metabolic panel  Screening for hepatitis C declined  Other microscopic hematuria    Return in about 1 year (around 10/28/2024), or if symptoms worsen or fail to improve, for annual physical.  Patient encouraged to exercise for 30 minutes most days of the week.  Continue healthy active lifestyle.  Continue follow-up with cardiology for history of A. tach.  Referral to GI for first colonoscopy.  Information given on health maintenance and disease prevention.  Elsie Sim Lent, MD  1/14 addendum: with menses today.

## 2023-11-28 ENCOUNTER — Other Ambulatory Visit: Payer: Self-pay | Admitting: Physician Assistant

## 2024-05-06 ENCOUNTER — Other Ambulatory Visit: Payer: Self-pay | Admitting: Physician Assistant

## 2024-05-14 ENCOUNTER — Other Ambulatory Visit: Payer: Self-pay | Admitting: Physician Assistant

## 2024-06-10 ENCOUNTER — Telehealth: Payer: Self-pay | Admitting: Internal Medicine

## 2024-06-10 MED ORDER — FLECAINIDE ACETATE 100 MG PO TABS: ORAL_TABLET | ORAL | 0 refills | Status: DC

## 2024-06-10 NOTE — Telephone Encounter (Signed)
 Pt's medication was sent to pt's pharmacy as requested. Confirmation received.

## 2024-06-10 NOTE — Telephone Encounter (Signed)
 *  STAT* If patient is at the pharmacy, call can be transferred to refill team.   1. Which medications need to be refilled? (please list name of each medication and dose if known)   flecainide  (TAMBOCOR ) 100 MG tablet   2. Which pharmacy/location (including street and city if local pharmacy) is medication to be sent to?  [id number] - [place],  - [address]     3. Do they need a 30 day or 90 day supply? 90 day  Patient has appt on 07/07/24  with Charlies Arthur

## 2024-06-18 ENCOUNTER — Other Ambulatory Visit: Payer: Self-pay | Admitting: Physician Assistant

## 2024-06-26 ENCOUNTER — Other Ambulatory Visit: Payer: Self-pay | Admitting: Family Medicine

## 2024-06-26 ENCOUNTER — Telehealth: Payer: Self-pay | Admitting: Family Medicine

## 2024-06-26 NOTE — Telephone Encounter (Signed)
 Patient dropped off document physicians result form, to be filled out by provider. Patient requested to send it back via Fax within 7-days. Document is located in providers tray at front office.Please advise at Mobile [phone] (mobile) Pt dropped off paperwork to be filled out . I put in the dr box

## 2024-07-06 NOTE — Progress Notes (Unsigned)
 Cardiology Office Note Date:  07/06/2024  Patient ID:  Heather Rice, Heather Rice Nov 23, 1975, MRN [id number] PCP:  Berneta Elsie Sayre, MD  Electrophysiologist: Dr. Fernande (inactive)   Chief Complaint:   overdue  History of Present Illness: Heather Rice is a 48 y.o. female with history of  VT, NSVT, PVC's,  treated with BB and Flecainide , she was referred to Dr. Kelsie and underwent EPS without inducible VT,  --- subsequently at her last office visit, a f/u with Dr. Kelsie, (Jan 2017) in discussion of treatment options (weaning Flecainide  and consider ablation if recurrent VT or continuing meds and watchful waiting), watchful waiting and ongoing medical therapy with Flecainide  was decided.  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 Joelene over the years  She saw Dr. Fernande 07/17/21, discussed occasional observations of low BP/HR with some fatigue, though not overtly symptomatic, anxiety improved on buspar  No changes were made.  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 No dizzy spells, near syncope or syncope. Had a great year last year, traveled quite a bit Sees her PMD regularly, has labs there Stable EKG/intervals  TODAY  She continues to do very well She has had some brief elevated HRs particularly a day or two ahead of menses of late, nothing at all that felt like her VT, not symptomatic, but aware of them. Takes a deep breath, settles.  No CP,SOB No near syncope or syncope, no dizzy spells.   Past Medical History:  Diagnosis Date   History of chicken pox    Thyroid  cyst 2004   After having her daughter but resolved   VT (ventricular tachycardia) (HCC)     Past Surgical History:  Procedure Laterality Date   ELECTROPHYSIOLOGIC STUDY N/A 09/27/2015   Procedure: PVC Ablation;   Surgeon: Lynwood Kelsie, MD;  Location: [facility];  Service: Cardiovascular;  Laterality: N/A;    Current Outpatient Medications  Medication Sig Dispense Refill   Adapalene -Benzoyl Peroxide  (EPIDUO  FORTE) 0.3-2.5 % GEL Apply a thin coat twice daily for 2 weeks and then see me in clinic. 45 g 0   Azelastine -Fluticasone  137-50 MCG/ACT SUSP SHAKE LIQUID AND USE 1 SPRAY IN EACH NOSTRIL TWICE DAILY 23 g 3   busPIRone (BUSPAR) 5 MG tablet Take 2.5 mg by mouth 2 (two) times daily.     cholecalciferol (VITAMIN D ) 1000 UNITS tablet Take 1,000 Units by mouth daily.     flecainide  (TAMBOCOR ) 100 MG tablet TAKE 1 TABLET BY MOUTH EVERY MORNING AND 1.5 TABLETS EVERY EVENING 75 tablet 0   levonorgestrel (MIRENA) 20 MCG/24HR IUD 1 each by Intrauterine route once. Implanted April 2018     valACYclovir  (VALTREX ) 1000 MG tablet TAKE 1/2 TABLET(500 MG) BY MOUTH TWICE DAILY FOR 3 DAYS 9 tablet 1   valACYclovir  (VALTREX ) 500 MG tablet Take 1 tablet twice daily for 3 days as needed for outbreaks. 6 tablet 8   No current facility-administered medications for this visit.    Allergies:   Patient has no known allergies.   Social History:  The patient  reports that she has never smoked. She has never used smokeless tobacco. She reports that she does not drink alcohol and does not use drugs.   Family History:  The patient's family history includes Arthritis in her mother; Birth defects in her maternal grandfather; Cancer in her  paternal grandfather; Diabetes in her maternal grandmother, mother, and paternal grandmother; Early death in her father.  ROS:  Please see the history of present illness.  All other systems are reviewed and otherwise negative.   PHYSICAL EXAM:  VS:  There were no vitals taken for this visit. BMI: There is no height or weight on file to calculate BMI. Well nourished, well developed, in no acute distress  HEENT: normocephalic, atraumatic  Neck: no JVD, carotid bruits or masses Cardiac:  RRR; no significant murmurs, no rubs, or gallops Lungs: CTA b/l, no wheezing, rhonchi or rales  Abd: soft, nontender MS: no deformity or atrophy Ext: no edema Skin: warm and dry, no rash Neuro:  No gross deficits appreciated Psych: euthymic mood, full affect    EKG:  Done today and reviewed by myself:  SR 64bpm, PR , QRS 90ms, QTc  01/07/23: SR 62, PR , QRS , QTc 07/17/21: SR 62bpm, PR , QRS 94ms, QTc 11/09/20 SR 63bpm, PR , QRS  , nonspecific T changes similar to others 06/22/20:   SR 65bpm, PR , QRS 86ms, QTc 416, unchanged from prior 02/22/20: SR 63bpm, PR , QRS 88ms, QTc , nonspecific ST/T changes, appears unchanged 06/02/18: SR 66bpm, PR , QRS 96ms, QTc , nonspecific T changes, unchanged from prior 12/28/16: SB, 58bpm, PR , QRS 90ms, QTc , nonspecific T changes look similar to previous   06/29/2020: TTE IMPRESSIONS   1. Left ventricular ejection fraction, by estimation, is 60 to 65%. The  left ventricle has normal function. The left ventricle has no regional  wall motion abnormalities. Left ventricular diastolic parameters were  normal.   2. Right ventricular systolic function is normal. The right ventricular  size is normal.   3. The mitral valve is grossly normal. No evidence of mitral valve  regurgitation.   4. The aortic valve was not well visualized. Aortic valve regurgitation  is not visualized.   5. The inferior vena cava is normal in size with greater than 50%  respiratory variability, suggesting right atrial pressure of 3 mmHg.   Conclusion(s)/Recommendation(s): Normal biventricular function without  evidence of hemodynamically significant valvular heart disease.    Oct 2021, monitor Findings HR  avg 71  Min 47-Max 140  PVCs Rare, less than 1%   PACs Rare, less than 1%   SVT Nonsustained 1 episodes    Multiple triggered events assoc with isolated PVC  09/27/2015:  EPS CONCLUSIONS:  1. Sinus rhythm upon presentation.  PVCs too infrequent for mapping or ablation today both on and off of isuprel . 2. No evidence of dual AV nodal physiology or accessory pathways 3. No inducible arrhythmias both on or off of isuprel  4. No early apparent complications.    07/14/2015: c.MRI IMPRESSION: 1. Normal left ventricular size, thickness and systolic function (LVEF = 67%) with no regional wall motion abnormalities.   2. Normal right ventricular size, thickness and systolic function (LVEF = 66%) with no regional wall motion abnormalities.   3.  Normal biatrial size.   4.  Trivial mitral and tricuspid regurgitation.   5. No evidence of late gadolinium enhancement in the myocardium of the left and right ventricle.   Collectively, there is no evidence for arrhythmogenic right ventricular dysplasia.  03/30/14: TTE LVEF 60-65% No significant valve disease  02/17/13: Stress myoview Normal myocardial perfusion study   Recent Labs: 10/29/2023: ALT 13; BUN 14; Creatinine, Ser 0.81; Hemoglobin 14.5; Platelets 182.0; Potassium 3.9; Sodium 139  10/29/2023: Cholesterol 206; HDL  74.50; LDL Cholesterol 114; Total CHOL/HDL Ratio 3; Triglycerides 87.0; VLDL 17.4   CrCl cannot be calculated (Patient's most recent lab result is older than the maximum 21 days allowed.).   Wt Readings from Last 3 Encounters:  10/29/23 183 lb (83 kg)  01/07/23 177 lb 9.6 oz (80.6 kg)  07/27/22 175 lb 3.2 oz (79.5 kg)     Other studies reviewed: Additional studies/records reviewed today include: summarized above  ASSESSMENT AND PLAN:  1. VT     on Flecainide , stable intervals     Record indicates intolerant of CCB, BB, she recalls feeling awful with metoprolol      palpitations well controlled     no symptoms of recurrent arrhythmia  Discussed transitioning to new EP MD, will plan to have her meet Dr. Almetta  Disposition: will have her back in 38mo, sooner if needed   Current  medicines are reviewed at length with the patient today.  The patient did not have any concerns regarding medicines.  Bonney Charlies Bard, PA-C 07/06/2024 6:09 AM     [facility] [address] [place] [address] [phone] (office)  [phone] (fax)

## 2024-07-07 ENCOUNTER — Ambulatory Visit: Attending: Physician Assistant | Admitting: Physician Assistant

## 2024-07-07 ENCOUNTER — Encounter: Payer: Self-pay | Admitting: Physician Assistant

## 2024-07-07 VITALS — BP 104/62 | HR 64 | Ht 69.0 in | Wt 176.6 lb

## 2024-07-07 DIAGNOSIS — I472 Ventricular tachycardia, unspecified: Secondary | ICD-10-CM | POA: Diagnosis not present

## 2024-07-07 DIAGNOSIS — Z5181 Encounter for therapeutic drug level monitoring: Secondary | ICD-10-CM

## 2024-07-07 DIAGNOSIS — Z79899 Other long term (current) drug therapy: Secondary | ICD-10-CM | POA: Diagnosis not present

## 2024-07-07 MED ORDER — FLECAINIDE ACETATE 100 MG PO TABS: ORAL_TABLET | ORAL | 3 refills | Status: AC

## 2024-07-07 NOTE — Patient Instructions (Addendum)
 Medication Instructions:   Your physician recommends that you continue on your current medications as directed. Please refer to the Current Medication list given to you today.   *If you need a refill on your cardiac medications before your next appointment, please call your pharmacy*    Lab Work:  NONE ORDERED  TODAY       If you have labs (blood work) drawn today and your tests are completely normal, you will receive your results only by: MyChart Message (if you have MyChart) OR A paper copy in the mail If you have any lab test that is abnormal or we need to change your treatment, we will call you to review the results.     Testing/Procedures: NONE ORDERED  TODAY       Follow-Up: At [facility], you and your health needs are our priority.  As part of our continuing mission to provide you with exceptional heart care, our providers are all part of one team.  This team includes your primary Cardiologist (physician) and Advanced Practice Providers or APPs (Physician Assistants and Nurse Practitioners) who all work together to provide you with the care you need, when you need it.  Your next appointment:   6  MONTHS   Provider:   You may see  Dr Almetta      We recommend signing up for the patient portal called MyChart.  Sign up information is provided on this After Visit Summary.  MyChart is used to connect with patients for Virtual Visits (Telemedicine).  Patients are able to view lab/test results, encounter notes, upcoming appointments, etc.  Non-urgent messages can be sent to your provider as well.   To learn more about what you can do with MyChart, go to [email or web address].   Other Instructions

## 2024-08-03 ENCOUNTER — Ambulatory Visit
Admission: RE | Admit: 2024-08-03 | Discharge: 2024-08-03 | Disposition: A | Discharge status: Home / Self Care | Source: Ambulatory Visit | Attending: Family Medicine | Admitting: Family Medicine

## 2024-08-03 ENCOUNTER — Other Ambulatory Visit: Payer: Self-pay | Admitting: Family Medicine

## 2024-08-03 DIAGNOSIS — Z1231 Encounter for screening mammogram for malignant neoplasm of breast: Secondary | ICD-10-CM

## 2024-09-23 NOTE — Telephone Encounter (Signed)
 Med refill request from 2023, denied. This was filled in September 2025

## 2024-10-09 ENCOUNTER — Other Ambulatory Visit: Payer: Self-pay | Admitting: Family Medicine

## 2024-10-09 DIAGNOSIS — J301 Allergic rhinitis due to pollen: Secondary | ICD-10-CM

## 2024-12-08 ENCOUNTER — Encounter: Admitting: Family Medicine
